# Patient Record
Sex: Female | Born: 1960 | Race: White | Hispanic: No | Marital: Married | Smoking: Never smoker
Health system: Southern US, Community
[De-identification: ages and names within clinical notes are randomized; demographics above are authoritative.]

## PROBLEM LIST (undated history)

## (undated) DIAGNOSIS — J45909 Unspecified asthma, uncomplicated: Secondary | ICD-10-CM

## (undated) DIAGNOSIS — M797 Fibromyalgia: Secondary | ICD-10-CM

## (undated) HISTORY — DX: Unspecified asthma, uncomplicated: J45.909

---

## 2015-07-11 ENCOUNTER — Other Ambulatory Visit: Payer: Self-pay

## 2015-07-11 DIAGNOSIS — Z1231 Encounter for screening mammogram for malignant neoplasm of breast: Secondary | ICD-10-CM

## 2015-08-15 ENCOUNTER — Ambulatory Visit
Admission: RE | Admit: 2015-08-15 | Discharge: 2015-08-15 | Disposition: A | Discharge status: Home / Self Care | Payer: BC Managed Care – PPO | Source: Ambulatory Visit

## 2015-08-15 DIAGNOSIS — Z1231 Encounter for screening mammogram for malignant neoplasm of breast: Secondary | ICD-10-CM

## 2016-03-19 ENCOUNTER — Other Ambulatory Visit: Payer: Self-pay | Admitting: Allergy

## 2016-03-19 ENCOUNTER — Ambulatory Visit
Admission: RE | Admit: 2016-03-19 | Discharge: 2016-03-19 | Disposition: A | Discharge status: Home / Self Care | Payer: BC Managed Care – PPO | Source: Ambulatory Visit | Attending: Allergy | Admitting: Allergy

## 2016-03-19 DIAGNOSIS — J4530 Mild persistent asthma, uncomplicated: Secondary | ICD-10-CM

## 2016-03-31 DIAGNOSIS — K219 Gastro-esophageal reflux disease without esophagitis: Secondary | ICD-10-CM | POA: Insufficient documentation

## 2016-03-31 DIAGNOSIS — J4540 Moderate persistent asthma, uncomplicated: Secondary | ICD-10-CM | POA: Insufficient documentation

## 2016-05-26 ENCOUNTER — Ambulatory Visit: Payer: BC Managed Care – PPO

## 2016-06-08 ENCOUNTER — Ambulatory Visit: Payer: BC Managed Care – PPO | Admitting: Physical Therapy

## 2016-06-11 ENCOUNTER — Ambulatory Visit: Payer: BC Managed Care – PPO

## 2016-09-17 ENCOUNTER — Other Ambulatory Visit: Payer: Self-pay | Admitting: Family Medicine

## 2016-09-17 DIAGNOSIS — Z1231 Encounter for screening mammogram for malignant neoplasm of breast: Secondary | ICD-10-CM

## 2016-10-05 ENCOUNTER — Ambulatory Visit
Admission: RE | Admit: 2016-10-05 | Discharge: 2016-10-05 | Disposition: A | Discharge status: Home / Self Care | Payer: BC Managed Care – PPO | Source: Ambulatory Visit | Attending: Family Medicine | Admitting: Family Medicine

## 2016-10-05 DIAGNOSIS — Z1231 Encounter for screening mammogram for malignant neoplasm of breast: Secondary | ICD-10-CM

## 2017-05-22 ENCOUNTER — Emergency Department (HOSPITAL_COMMUNITY): Payer: BC Managed Care – PPO

## 2017-05-22 ENCOUNTER — Encounter (HOSPITAL_COMMUNITY): Payer: Self-pay

## 2017-05-22 DIAGNOSIS — R0789 Other chest pain: Secondary | ICD-10-CM | POA: Insufficient documentation

## 2017-05-22 LAB — CBC
HCT: 33.9 % — ABNORMAL LOW (ref 36.0–46.0)
Hemoglobin: 11.2 g/dL — ABNORMAL LOW (ref 12.0–15.0)
MCH: 29.3 pg (ref 26.0–34.0)
MCHC: 33 g/dL (ref 30.0–36.0)
MCV: 88.7 fL (ref 78.0–100.0)
PLATELETS: 179 10*3/uL (ref 150–400)
RBC: 3.82 MIL/uL — AB (ref 3.87–5.11)
RDW: 13.2 % (ref 11.5–15.5)
WBC: 6.5 10*3/uL (ref 4.0–10.5)

## 2017-05-22 LAB — BASIC METABOLIC PANEL
Anion gap: 6 (ref 5–15)
BUN: 15 mg/dL (ref 6–20)
CALCIUM: 9.7 mg/dL (ref 8.9–10.3)
CHLORIDE: 103 mmol/L (ref 101–111)
CO2: 29 mmol/L (ref 22–32)
CREATININE: 0.77 mg/dL (ref 0.44–1.00)
Glucose, Bld: 111 mg/dL — ABNORMAL HIGH (ref 65–99)
Potassium: 3.4 mmol/L — ABNORMAL LOW (ref 3.5–5.1)
SODIUM: 138 mmol/L (ref 135–145)

## 2017-05-22 LAB — I-STAT TROPONIN, ED: TROPONIN I, POC: 0 ng/mL (ref 0.00–0.08)

## 2017-05-22 NOTE — ED Triage Notes (Signed)
Pt. Reports waking up yesterday morning with chest pain below the shoulders that radiates to her stomach. Pt. Reports it did not wake her up from her sleep. Pt. Denies n/v/ diaphoresis.

## 2017-05-23 ENCOUNTER — Emergency Department (HOSPITAL_COMMUNITY)
Admission: EM | Admit: 2017-05-23 | Discharge: 2017-05-23 | Disposition: A | Discharge status: Home / Self Care | Payer: BC Managed Care – PPO | Attending: Emergency Medicine | Admitting: Emergency Medicine

## 2017-05-23 DIAGNOSIS — R0789 Other chest pain: Secondary | ICD-10-CM

## 2017-05-23 LAB — LIPASE, BLOOD: Lipase: 28 U/L (ref 11–51)

## 2017-05-23 LAB — I-STAT TROPONIN, ED: Troponin i, poc: 0 ng/mL (ref 0.00–0.08)

## 2017-05-23 LAB — HEPATIC FUNCTION PANEL
ALK PHOS: 49 U/L (ref 38–126)
ALT: 20 U/L (ref 14–54)
AST: 24 U/L (ref 15–41)
Albumin: 3.9 g/dL (ref 3.5–5.0)
Total Bilirubin: 0.5 mg/dL (ref 0.3–1.2)
Total Protein: 6 g/dL — ABNORMAL LOW (ref 6.5–8.1)

## 2017-05-23 MED ORDER — ONDANSETRON 4 MG PO TBDP
4.0000 mg | ORAL_TABLET | Freq: Once | ORAL | Status: AC
  Administered 2017-05-23: 4 mg via ORAL
  Filled 2017-05-23: qty 1

## 2017-05-23 MED ORDER — PANTOPRAZOLE SODIUM 40 MG PO TBEC
40.0000 mg | DELAYED_RELEASE_TABLET | Freq: Once | ORAL | Status: AC
  Administered 2017-05-23: 40 mg via ORAL
  Filled 2017-05-23: qty 1

## 2017-05-23 MED ORDER — GI COCKTAIL ~~LOC~~
30.0000 mL | Freq: Once | ORAL | Status: AC
  Administered 2017-05-23: 30 mL via ORAL
  Filled 2017-05-23: qty 30

## 2017-05-23 MED ORDER — SIMETHICONE 40 MG/0.6ML PO SUSP (UNIT DOSE)
40.0000 mg | Freq: Once | ORAL | Status: AC
  Administered 2017-05-23: 40 mg via ORAL
  Filled 2017-05-23: qty 0.6

## 2017-05-23 NOTE — Discharge Instructions (Signed)
You have had normal labs today including 2 normal sets of cardiac enzymes. Your chest x-ray was clear. Please follow-up closely with your primary care provider.

## 2017-05-23 NOTE — ED Provider Notes (Signed)
TIME SEEN: 3:39 AM  CHIEF COMPLAINT: Chest pain  HPI: Patient is a 56 year old female with history of asthma who presents emergency department with complaints of left-sided chest pain that started yesterday morning when she woke up from sleep. He describes the pain is constant but it does wax and wane. States it feels like "gas pain". No aggravating or relieving factors. It is not exertional or pleuritic. Does not change any food. Has tried Tylenol and Mylanta without relief. No associated shortness of breath, nausea or vomiting, diaphoresis or dizziness. No history of hypertension, diabetes, hyperlipidemia or tobacco use. Does have a family history of a father who has had 3 MIs. His first MI was in his 41s.  No history of PE, DVT, exogenous estrogen use, recent fractures, surgery, trauma, hospitalization or prolonged travel. No lower extremity swelling or pain. No calf tenderness.  ROS: See HPI Constitutional: no fever  Eyes: no drainage  ENT: no runny nose   Cardiovascular:   chest pain  Resp: no SOB  GI: no vomiting GU: no dysuria Integumentary: no rash  Allergy: no hives  Musculoskeletal: no leg swelling  Neurological: no slurred speech ROS otherwise negative  PAST MEDICAL HISTORY/PAST SURGICAL HISTORY:  No past medical history on file.  MEDICATIONS:  Prior to Admission medications   Not on File    ALLERGIES:  Not on File  SOCIAL HISTORY:  Social History  Substance Use Topics  . Smoking status: Never Smoker  . Smokeless tobacco: Never Used  . Alcohol use Not on file    FAMILY HISTORY: No family history on file.  EXAM: BP (!) 157/102 (BP Location: Right Arm)   Pulse 62   Temp 98.2 F (36.8 C) (Oral)   Resp 18   Ht  (1.803 m)   Wt 79.4 kg (175 lb)   SpO2 100%   BMI 24.41 kg/m  CONSTITUTIONAL: Alert and oriented and responds appropriately to questions. Well-appearing; well-nourished HEAD: Normocephalic EYES: Conjunctivae clear, pupils appear equal,  EOMI ENT: normal nose; moist mucous membranes NECK: Supple, no meningismus, no nuchal rigidity, no LAD  CARD: RRR; S1 and S2 appreciated; no murmurs, no clicks, no rubs, no gallops CHEST:  Chest wall is mildly tender to palpation underneath the left breast.  No crepitus, ecchymosis, erythema, warmth, rash or other lesions present.   BREAST:  No breast mass palpated on exam.  Left breast is non-tender to palpation.  No erythema, warmth, induration, fluctuance.  No nipple discharge.  No inverted nipple.  No skin changes.  No axillary lymphadenopathy. RESP: Normal chest excursion without splinting or tachypnea; breath sounds clear and equal bilaterally; no wheezes, no rhonchi, no rales, no hypoxia or respiratory distress, speaking full sentences ABD/GI: Normal bowel sounds; non-distended; soft, non-tender, no rebound, no guarding, no peritoneal signs, no hepatosplenomegaly BACK:  The back appears normal and is non-tender to palpation, there is no CVA tenderness EXT: Normal ROM in all joints; non-tender to palpation; no edema; normal capillary refill; no cyanosis, no calf tenderness or swelling    SKIN: Normal color for age and race; warm; no rash NEURO: Moves all extremities equally PSYCH: The patient's mood and manner are appropriate. Grooming and personal hygiene are appropriate.  MEDICAL DECISION MAKING: Patient here with atypical chest pain. She has no significant risk factors for ACS other than age and family history. EKG shows no ischemic abnormality. Doubt PE or dissection. She has had 2 negative troponins. LFTs, lipase, creatinine normal. No leukocytosis. Chest x-ray clear. We'll treat symptoms  a GI cocktail, Protonix, Zofran and simethicone and reassess. Suspect this is more GI related.  ED PROGRESS: 4:50 AM  Pt received her medications but does not want to wait to see if they work to help her symptoms. She states she can follow-up with her primary care provider. She is comfortable with plan to  go home. Discussed return precautions.   At this time, I do not feel there is any life-threatening condition present. I have reviewed and discussed all results (EKG, imaging, lab, urine as appropriate) and exam findings with patient/family. I have reviewed nursing notes and appropriate previous records.  I feel the patient is safe to be discharged home without further emergent workup and can continue workup as an outpatient as needed. Discussed usual and customary return precautions. Patient/family verbalize understanding and are comfortable with this plan.  Outpatient follow-up has been provided if needed. All questions have been answered.      EKG Interpretation  Date/Time:  Saturday May 22 2017 20:18:12 EDT Ventricular Rate:  70 PR Interval:  142 QRS Duration: 110 QT Interval:  406 QTC Calculation: 438 R Axis:   50 Text Interpretation:  Normal sinus rhythm Incomplete right bundle branch block Nonspecific ST and T wave abnormality Abnormal ECG No significant change since last tracing Confirmed by Ward, Baxter Hire [phone]) on 05/23/2017 4:18:52 AM         Ward, Layla Maw, DO 05/23/17 0451

## 2017-09-20 ENCOUNTER — Other Ambulatory Visit: Payer: Self-pay | Admitting: Family Medicine

## 2017-09-20 DIAGNOSIS — Z1231 Encounter for screening mammogram for malignant neoplasm of breast: Secondary | ICD-10-CM

## 2017-10-11 ENCOUNTER — Ambulatory Visit
Admission: RE | Admit: 2017-10-11 | Discharge: 2017-10-11 | Disposition: A | Discharge status: Home / Self Care | Payer: BC Managed Care – PPO | Source: Ambulatory Visit | Attending: Family Medicine | Admitting: Family Medicine

## 2017-10-11 DIAGNOSIS — Z1231 Encounter for screening mammogram for malignant neoplasm of breast: Secondary | ICD-10-CM

## 2018-04-05 ENCOUNTER — Encounter: Payer: Self-pay | Admitting: Podiatry

## 2018-04-05 ENCOUNTER — Ambulatory Visit: Payer: BC Managed Care – PPO | Admitting: Podiatry

## 2018-04-05 ENCOUNTER — Ambulatory Visit (INDEPENDENT_AMBULATORY_CARE_PROVIDER_SITE_OTHER): Payer: BC Managed Care – PPO

## 2018-04-05 DIAGNOSIS — M2011 Hallux valgus (acquired), right foot: Secondary | ICD-10-CM

## 2018-04-05 DIAGNOSIS — M2012 Hallux valgus (acquired), left foot: Secondary | ICD-10-CM

## 2018-04-05 DIAGNOSIS — M2142 Flat foot [pes planus] (acquired), left foot: Secondary | ICD-10-CM

## 2018-04-05 DIAGNOSIS — M722 Plantar fascial fibromatosis: Secondary | ICD-10-CM

## 2018-04-05 DIAGNOSIS — M2141 Flat foot [pes planus] (acquired), right foot: Secondary | ICD-10-CM | POA: Diagnosis not present

## 2018-04-05 NOTE — Progress Notes (Signed)
Subjective:    Patient ID: Rachel Fuentes, female    DOB: 03/01/1961, 57 y.o.   MRN: [id number]  HPI 57 year old female presents the office today for concerns of bunions to both of her feet.  She has had orthotics for several years she has a pair that is comfortable but very worn out.  She tried getting a secondary pair however they were uncomfortable she was not able to tolerate them.  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  No recent injury.  No other concerns.  Review of Systems  All other systems reviewed and are negative.  History reviewed. No pertinent past medical history.  History reviewed. No pertinent surgical history.   Current Outpatient Medications:  .  BREO ELLIPTA 200-25 MCG/INH AEPB, INL 1 PUFF PO QD, Disp: , Rfl: 1 .  buPROPion (WELLBUTRIN XL) 300 MG 24 hr tablet, TK 1 T PO ONCE QAM, Disp: , Rfl: 3 .  diclofenac (VOLTAREN) 50 MG EC tablet, TK 1 T PO TID WF OR MILK, Disp: , Rfl: 3 .  fluconazole (DIFLUCAN) 150 MG tablet, TK 1 T PO 1 TIME WEEKLY, Disp: , Rfl: 4 .  FLUoxetine (PROZAC) 40 MG capsule, TK 1 C PO QD, Disp: , Rfl: 3 .  gabapentin (NEURONTIN) 400 MG capsule, TK 2 CS PO HS, Disp: , Rfl: 2 .  gabapentin (NEURONTIN) 800 MG tablet, TK 1 T PO HS, Disp: , Rfl: 3 .  levocetirizine (XYZAL) 5 MG tablet, TK 1 T PO ONCE QPM, Disp: , Rfl: 0 .  levothyroxine (SYNTHROID, LEVOTHROID) 50 MCG tablet, TK 1 T PO QD, Disp: , Rfl: 2 .  mometasone (NASONEX) 50 MCG/ACT nasal spray, , Disp: , Rfl:  .  montelukast (SINGULAIR) 10 MG tablet, TK 1 T PO ONCE A DAY IN THE EVE, Disp: , Rfl: 0 .  PREMARIN vaginal cream, , Disp: , Rfl: 11 .  traMADol (ULTRAM) 50 MG tablet, , Disp: , Rfl:  .  trazodone (DESYREL) 300 MG tablet, TK 1 T PO HS PRN, Disp: , Rfl: 11 .  valACYclovir (VALTREX) 1000 MG tablet, TK 1 T PO TID PRN DURING FLARE, Disp: , Rfl: 8  Not on File      Objective:   Physical Exam  General: AAO x3,  NAD  Dermatological: Skin is warm, dry and supple bilateral. Nails x 10 are well manicured; remaining integument appears unremarkable at this time. There are no open sores, no preulcerative lesions, no rash or signs of infection present.  Vascular: Dorsalis Pedis artery and Posterior Tibial artery pedal pulses are 2/4 bilateral with immedate capillary fill time. Pedal hair growth present. No varicosities and no lower extremity edema present bilateral. There is no pain with calf compression, swelling, warmth, erythema.   Neruologic: Grossly intact via light touch bilateral.. Protective threshold with Semmes Wienstein monofilament intact to all pedal sites bilateral.  Musculoskeletal: Moderate bunion deformity is present bilaterally.  There is no significant discomfort on the bunion deformities present today.  There is no pain or crepitation with MPJ range of motion.  Decreased medial arch height.  At this time there is no tenderness along the arch of the foot but at times she does get this.  Plantar fascial, Achilles tendon appear to be intact.  No edema, erythema.  Muscular strength 5/5 in all groups tested bilateral.  Gait: Unassisted, Nonantalgic.     Assessment & Plan:  57 year old female with bilateral bunion deformities, arch pain/plantar fasciitis -Treatment options discussed including all alternatives, risks, and complications -Etiology of symptoms were discussed -X-rays were obtained and reviewed with the patient.  Bunion deformities present.  There is no evidence of acute fracture identified today. -We discussed various treatment options.  She will get a new pair of orthotics.  Rick evaluated her today she was molded for inserts.  Discussed shoe modifications as well.  We also discussed surgery in the future if symptoms continue or worsen.  Vivi Barrack DPM

## 2018-04-19 ENCOUNTER — Ambulatory Visit: Payer: BC Managed Care – PPO | Admitting: Orthotics

## 2018-04-19 DIAGNOSIS — M722 Plantar fascial fibromatosis: Secondary | ICD-10-CM

## 2018-04-19 DIAGNOSIS — M2142 Flat foot [pes planus] (acquired), left foot: Secondary | ICD-10-CM

## 2018-04-19 DIAGNOSIS — M2012 Hallux valgus (acquired), left foot: Principal | ICD-10-CM

## 2018-04-19 DIAGNOSIS — M2141 Flat foot [pes planus] (acquired), right foot: Secondary | ICD-10-CM

## 2018-04-19 DIAGNOSIS — M2011 Hallux valgus (acquired), right foot: Secondary | ICD-10-CM

## 2018-04-19 NOTE — Progress Notes (Signed)
Patient came in today to pick up custom made foot orthotics.  The goals were accomplished and the patient reported no dissatisfaction with said orthotics.  Patient was advised of breakin period and how to report any issues. 

## 2018-10-25 ENCOUNTER — Other Ambulatory Visit: Payer: Self-pay | Admitting: Family Medicine

## 2018-10-25 DIAGNOSIS — Z1231 Encounter for screening mammogram for malignant neoplasm of breast: Secondary | ICD-10-CM

## 2018-11-22 ENCOUNTER — Ambulatory Visit: Payer: BC Managed Care – PPO

## 2019-04-12 ENCOUNTER — Other Ambulatory Visit: Payer: Self-pay | Admitting: Emergency Medicine

## 2019-04-12 DIAGNOSIS — Z20822 Contact with and (suspected) exposure to covid-19: Secondary | ICD-10-CM

## 2019-04-13 LAB — NOVEL CORONAVIRUS, NAA: SARS-CoV-2, NAA: NOT DETECTED

## 2019-05-29 ENCOUNTER — Other Ambulatory Visit: Payer: Self-pay

## 2019-05-29 ENCOUNTER — Ambulatory Visit
Admission: RE | Admit: 2019-05-29 | Discharge: 2019-05-29 | Disposition: A | Discharge status: Home / Self Care | Payer: BC Managed Care – PPO | Source: Ambulatory Visit | Attending: Family Medicine | Admitting: Family Medicine

## 2019-05-29 DIAGNOSIS — Z1231 Encounter for screening mammogram for malignant neoplasm of breast: Secondary | ICD-10-CM

## 2020-01-30 ENCOUNTER — Other Ambulatory Visit: Payer: Self-pay | Admitting: Family Medicine

## 2020-01-30 DIAGNOSIS — Z1231 Encounter for screening mammogram for malignant neoplasm of breast: Secondary | ICD-10-CM

## 2020-02-22 ENCOUNTER — Other Ambulatory Visit: Payer: Self-pay | Admitting: Family Medicine

## 2020-02-22 DIAGNOSIS — E2839 Other primary ovarian failure: Secondary | ICD-10-CM

## 2020-04-05 ENCOUNTER — Ambulatory Visit: Payer: BC Managed Care – PPO | Admitting: Dietician

## 2020-04-12 ENCOUNTER — Ambulatory Visit: Payer: BC Managed Care – PPO | Admitting: Allergy

## 2020-05-01 ENCOUNTER — Ambulatory Visit: Payer: BC Managed Care – PPO | Admitting: Dietician

## 2020-05-29 ENCOUNTER — Ambulatory Visit: Payer: BC Managed Care – PPO

## 2020-06-03 DIAGNOSIS — M546 Pain in thoracic spine: Secondary | ICD-10-CM | POA: Insufficient documentation

## 2020-06-11 ENCOUNTER — Other Ambulatory Visit: Payer: BC Managed Care – PPO

## 2020-06-24 ENCOUNTER — Other Ambulatory Visit: Payer: Self-pay

## 2020-06-24 ENCOUNTER — Ambulatory Visit
Admission: RE | Admit: 2020-06-24 | Discharge: 2020-06-24 | Disposition: A | Discharge status: Home / Self Care | Payer: BC Managed Care – PPO | Source: Ambulatory Visit | Attending: Family Medicine | Admitting: Family Medicine

## 2020-06-24 DIAGNOSIS — E2839 Other primary ovarian failure: Secondary | ICD-10-CM

## 2020-06-28 ENCOUNTER — Other Ambulatory Visit: Payer: Self-pay

## 2020-06-28 ENCOUNTER — Encounter: Payer: Self-pay | Admitting: Pulmonary Disease

## 2020-06-28 ENCOUNTER — Ambulatory Visit: Payer: BC Managed Care – PPO | Admitting: Pulmonary Disease

## 2020-06-28 ENCOUNTER — Ambulatory Visit (INDEPENDENT_AMBULATORY_CARE_PROVIDER_SITE_OTHER): Payer: BC Managed Care – PPO

## 2020-06-28 VITALS — BP 108/68 | HR 88 | Temp 97.7°F | Ht 71.0 in | Wt 169.8 lb

## 2020-06-28 DIAGNOSIS — J45909 Unspecified asthma, uncomplicated: Secondary | ICD-10-CM

## 2020-06-28 DIAGNOSIS — R0602 Shortness of breath: Secondary | ICD-10-CM

## 2020-06-28 MED ORDER — TRELEGY ELLIPTA 200-62.5-25 MCG/INH IN AEPB: 1.0000 | INHALATION_SPRAY | Freq: Every day | RESPIRATORY_TRACT | 0 refills | Status: DC

## 2020-06-28 NOTE — Patient Instructions (Signed)
Uncontrolled asthma Multiple allergies  We will switch you from Breo to Trelegy 200, 1 puff daily  Continue albuterol as needed  Complete course of antibiotics and steroids  Blood work to include CBC, eosinophils, IgE levels-this should be done greater than 2 weeks after being off steroids  Obtain pulmonary function tests  Chest x-ray  I will see you back in 6 to 8 weeks

## 2020-06-28 NOTE — Progress Notes (Signed)
Rachel Fuentes    [id number]    [birth year]  Primary Care Physician:Webb, Okey Regal, MD  Referring Physician: Shirlean Mylar, MD [address] [place],  [place] [address]  Chief complaint:   Patient with history of asthma Uncontrolled symptoms for the last 14 months  HPI:  Worsening symptoms of asthma, shortness of breath Does not usually wheeze  Multiple allergens to cats and dogs Did change flooring but noticed that breathing is worse  Albuterol is not working as well as he used to  She was taking allergy shots for multiple allergies  Asthma is for most of her adult life  She does have a history of reflux-on famotidine History of gastroparesis, fibromyalgia, osteoarthritis, anxiety and depression  Currently on Breo, Singulair  Occupational history-office work, walk to the grocery store  Any kind of activity recently contributes to shortness of breath  Pets: Dogs and cats  Occupation: Office work, grocery  Exposures: New flooring did make symptoms worse  Smoking history: Never smoker, was exposed to a lot of secondhand smoke growing up  Outpatient Encounter Medications as of 06/28/2020  Medication Sig  . azithromycin (ZITHROMAX) 250 MG tablet Take by mouth.  Marland Kitchen BREO ELLIPTA 200-25 MCG/INH AEPB INL 1 PUFF PO QD  . buPROPion (WELLBUTRIN XL) 300 MG 24 hr tablet TK 1 T PO ONCE QAM  . diclofenac (VOLTAREN) 50 MG EC tablet TK 1 T PO TID WF OR MILK  . fluconazole (DIFLUCAN) 150 MG tablet TK 1 T PO 1 TIME WEEKLY  . FLUoxetine (PROZAC) 40 MG capsule TK 1 C PO QD  . gabapentin (NEURONTIN) 400 MG capsule TK 2 CS PO HS  . gabapentin (NEURONTIN) 800 MG tablet TK 1 T PO HS  . levocetirizine (XYZAL) 5 MG tablet TK 1 T PO ONCE QPM  . levothyroxine (SYNTHROID, LEVOTHROID) 50 MCG tablet TK 1 T PO QD  . mometasone (NASONEX) 50 MCG/ACT nasal spray   . montelukast (SINGULAIR) 10 MG tablet TK 1 T PO ONCE A DAY IN THE EVE  . predniSONE (STERAPRED  UNI-PAK 21 TAB) 10 MG (21) TBPK tablet Use as directed on packaging.  . traMADol (ULTRAM) 50 MG tablet   . trazodone (DESYREL) 300 MG tablet TK 1 T PO HS PRN  . valACYclovir (VALTREX) 1000 MG tablet TK 1 T PO TID PRN DURING FLARE  . PREMARIN vaginal cream    No facility-administered encounter medications on file as of 06/28/2020.    Allergies as of 06/28/2020  . (Not on File)    Past Medical History:  Diagnosis Date  . Asthma     No past surgical history on file.  Family History  Problem Relation Age of Onset  . Breast cancer Neg Hx     Social History   Socioeconomic History  . Marital status: Married    Spouse name: Not on file  . Number of children: Not on file  . Years of education: Not on file  . Highest education level: Not on file  Occupational History  . Not on file  Tobacco Use  . Smoking status: Never Smoker  . Smokeless tobacco: Never Used  Substance and Sexual Activity  . Alcohol use: Not on file  . Drug use: Not on file  . Sexual activity: Not on file  Other Topics Concern  . Not on file  Social History Narrative  . Not on file   Social Determinants of Health   Financial  Resource Strain:   . Difficulty of Paying Living Expenses: Not on file  Food Insecurity:   . Worried About Programme researcher, broadcasting/film/video in the Last Year: Not on file  . Ran Out of Food in the Last Year: Not on file  Transportation Needs:   . Lack of Transportation (Medical): Not on file  . Lack of Transportation (Non-Medical): Not on file  Physical Activity:   . Days of Exercise per Week: Not on file  . Minutes of Exercise per Session: Not on file  Stress:   . Feeling of Stress : Not on file  Social Connections:   . Frequency of Communication with Friends and Family: Not on file  . Frequency of Social Gatherings with Friends and Family: Not on file  . Attends Religious Services: Not on file  . Active Member of Clubs or Organizations: Not on file  . Attends Banker  Meetings: Not on file  . Marital Status: Not on file  Intimate Partner Violence:   . Fear of Current or Ex-Partner: Not on file  . Emotionally Abused: Not on file  . Physically Abused: Not on file  . Sexually Abused: Not on file    Review of Systems  Constitutional: Positive for fatigue.  Respiratory: Positive for shortness of breath.     Vitals:   06/28/20 1002  BP: 108/68  Pulse: 88  Temp: 97.7 F (36.5 C)  SpO2: 98%     Physical Exam Constitutional:      Appearance: Normal appearance.  HENT:     Head: Normocephalic and atraumatic.     Mouth/Throat:     Mouth: Mucous membranes are moist.  Eyes:     General:        Right eye: No discharge.        Left eye: No discharge.  Cardiovascular:     Rate and Rhythm: Normal rate and regular rhythm.     Pulses: Normal pulses.     Heart sounds: Normal heart sounds. No murmur heard.  No friction rub.  Pulmonary:     Effort: Pulmonary effort is normal. No respiratory distress.     Breath sounds: Normal breath sounds. No stridor. No wheezing or rhonchi.  Musculoskeletal:     Cervical back: Normal range of motion. No rigidity.  Psychiatric:        Mood and Affect: Mood normal.    Data Reviewed: No previous x-rays or other studies available  Assessment:  Shortness of breath  Moderate persistent asthma  Multiple allergies   History of anxiety and depression -Anxiety regarding recent uncontrolled symptoms may also be playing a role in worsening symptoms  Significance of avoidance of triggers discussed extensively -She is known to be allergic to dogs and cats and she does have 4 cats -She also did notice significant worsening of symptoms with flooring change  Plan/Recommendations: Obtain CBC with differentials, IgE levels  Chest x-ray  Switch from Breo to Trelegy 200  Encouraged to avoid known triggers  Obtain PFT  Follow-up in 6 to 8 weeks   Virl Diamond MD [facility] 06/28/2020, 10:44 AM  CC: Shirlean Mylar, MD

## 2020-07-01 ENCOUNTER — Other Ambulatory Visit: Payer: Self-pay

## 2020-07-01 ENCOUNTER — Ambulatory Visit
Admission: RE | Admit: 2020-07-01 | Discharge: 2020-07-01 | Disposition: A | Discharge status: Home / Self Care | Payer: BC Managed Care – PPO | Source: Ambulatory Visit | Attending: Family Medicine | Admitting: Family Medicine

## 2020-07-01 DIAGNOSIS — Z1231 Encounter for screening mammogram for malignant neoplasm of breast: Secondary | ICD-10-CM

## 2020-07-22 ENCOUNTER — Institutional Professional Consult (permissible substitution): Payer: BC Managed Care – PPO | Admitting: Pulmonary Disease

## 2020-07-25 ENCOUNTER — Telehealth: Payer: Self-pay | Admitting: Pulmonary Disease

## 2020-07-25 DIAGNOSIS — J4540 Moderate persistent asthma, uncomplicated: Secondary | ICD-10-CM

## 2020-07-25 MED ORDER — TRELEGY ELLIPTA 200-62.5-25 MCG/INH IN AEPB: 1.0000 | INHALATION_SPRAY | Freq: Every day | RESPIRATORY_TRACT | 5 refills | Status: DC

## 2020-07-25 NOTE — Telephone Encounter (Signed)
I have sent the refills of the trelegy to the pharmacy.  I called the pt and she is aware. Nothing further is needed.

## 2020-10-11 ENCOUNTER — Other Ambulatory Visit: Payer: Self-pay | Admitting: Family Medicine

## 2020-10-11 DIAGNOSIS — Z8249 Family history of ischemic heart disease and other diseases of the circulatory system: Secondary | ICD-10-CM

## 2020-10-11 DIAGNOSIS — E785 Hyperlipidemia, unspecified: Secondary | ICD-10-CM

## 2020-12-10 ENCOUNTER — Other Ambulatory Visit (HOSPITAL_COMMUNITY)
Admission: RE | Admit: 2020-12-10 | Discharge: 2020-12-10 | Disposition: A | Discharge status: Home / Self Care | Payer: BC Managed Care – PPO | Source: Ambulatory Visit | Attending: Pulmonary Disease | Admitting: Pulmonary Disease

## 2020-12-10 ENCOUNTER — Ambulatory Visit: Payer: BC Managed Care – PPO | Admitting: Adult Health

## 2020-12-10 DIAGNOSIS — Z20822 Contact with and (suspected) exposure to covid-19: Secondary | ICD-10-CM | POA: Insufficient documentation

## 2020-12-10 DIAGNOSIS — Z01812 Encounter for preprocedural laboratory examination: Secondary | ICD-10-CM | POA: Insufficient documentation

## 2020-12-11 LAB — SARS CORONAVIRUS 2 (TAT 6-24 HRS): SARS Coronavirus 2: NEGATIVE

## 2020-12-13 ENCOUNTER — Ambulatory Visit: Payer: BC Managed Care – PPO | Admitting: Pulmonary Disease

## 2020-12-13 ENCOUNTER — Other Ambulatory Visit: Payer: Self-pay

## 2020-12-13 ENCOUNTER — Encounter: Payer: Self-pay | Admitting: Pulmonary Disease

## 2020-12-13 ENCOUNTER — Ambulatory Visit (INDEPENDENT_AMBULATORY_CARE_PROVIDER_SITE_OTHER): Payer: BC Managed Care – PPO | Admitting: Pulmonary Disease

## 2020-12-13 DIAGNOSIS — J4540 Moderate persistent asthma, uncomplicated: Secondary | ICD-10-CM | POA: Diagnosis not present

## 2020-12-13 DIAGNOSIS — R0602 Shortness of breath: Secondary | ICD-10-CM | POA: Diagnosis not present

## 2020-12-13 LAB — PULMONARY FUNCTION TEST
DL/VA % pred: 97 %
DL/VA: 3.89 ml/min/mmHg/L
DLCO cor % pred: 99 %
DLCO cor: 25.15 ml/min/mmHg
DLCO unc % pred: 99 %
DLCO unc: 25.15 ml/min/mmHg
FEF 25-75 Post: 3.18 L/sec
FEF 25-75 Pre: 3.11 L/sec
FEF2575-%Change-Post: 2 %
FEF2575-%Pred-Post: 114 %
FEF2575-%Pred-Pre: 112 %
FEV1-%Change-Post: 1 %
FEV1-%Pred-Post: 107 %
FEV1-%Pred-Pre: 105 %
FEV1-Post: 3.49 L
FEV1-Pre: 3.45 L
FEV1FVC-%Change-Post: 3 %
FEV1FVC-%Pred-Pre: 100 %
FEV6-%Change-Post: -1 %
FEV6-%Pred-Post: 105 %
FEV6-%Pred-Pre: 107 %
FEV6-Post: 4.31 L
FEV6-Pre: 4.39 L
FEV6FVC-%Change-Post: 0 %
FEV6FVC-%Pred-Post: 103 %
FEV6FVC-%Pred-Pre: 103 %
FVC-%Change-Post: -2 %
FVC-%Pred-Post: 102 %
FVC-%Pred-Pre: 104 %
FVC-Post: 4.31 L
FVC-Pre: 4.4 L
Post FEV1/FVC ratio: 81 %
Post FEV6/FVC ratio: 100 %
Pre FEV1/FVC ratio: 78 %
Pre FEV6/FVC Ratio: 100 %
RV % pred: 121 %
RV: 2.85 L
TLC % pred: 117 %
TLC: 7.19 L

## 2020-12-13 MED ORDER — TRELEGY ELLIPTA 200-62.5-25 MCG/INH IN AEPB: 1.0000 | INHALATION_SPRAY | Freq: Every day | RESPIRATORY_TRACT | 5 refills | Status: DC

## 2020-12-13 NOTE — Progress Notes (Signed)
Rachel Fuentes    [id number]    10/22/1960  Primary Care Physician:Webb, Okey Regal, MD  Referring Physician: Shirlean Mylar, MD [address] [place],  [place] [address]  Chief complaint:   Patient with history of asthma Symptoms did improve with use of Trelegy  HPI:  No significant symptoms today Feels better Trelegy did help Albuterol use about 3 times a week  Still with significant exposure to known triggers  Multiple allergens to cats and dogs Did change flooring but noticed that breathing is worse  She was taking allergy shots for multiple allergies  Asthma is for most of her adult life  She does have a history of reflux-on famotidine History of gastroparesis, fibromyalgia, osteoarthritis, anxiety and depression  We did prescribe Trelegy the last time she was in the office Occupational history-office work  Any kind of activity recently contributes to shortness of breath  Pets: Dogs and cats  Occupation: Office work, grocery  Exposures: New flooring did make symptoms worse  Smoking history: Never smoker, was exposed to a lot of secondhand smoke growing up  Outpatient Encounter Medications as of 12/13/2020  Medication Sig  . BREO ELLIPTA 200-25 MCG/INH AEPB INL 1 PUFF PO QD  . buPROPion (WELLBUTRIN XL) 300 MG 24 hr tablet TK 1 T PO ONCE QAM  . diclofenac (VOLTAREN) 50 MG EC tablet TK 1 T PO TID WF OR MILK  . fluconazole (DIFLUCAN) 150 MG tablet TK 1 T PO 1 TIME WEEKLY  . FLUoxetine (PROZAC) 40 MG capsule TK 1 C PO QD  . Fluticasone-Umeclidin-Vilant (TRELEGY ELLIPTA) 200-62.5-25 MCG/INH AEPB Inhale 1 puff into the lungs daily.  Marland Kitchen gabapentin (NEURONTIN) 400 MG capsule TK 2 CS PO HS  . gabapentin (NEURONTIN) 800 MG tablet TK 1 T PO HS  . levocetirizine (XYZAL) 5 MG tablet TK 1 T PO ONCE QPM  . levothyroxine (SYNTHROID, LEVOTHROID) 50 MCG tablet TK 1 T PO QD  . mometasone (NASONEX) 50 MCG/ACT nasal spray   . montelukast (SINGULAIR)  10 MG tablet TK 1 T PO ONCE A DAY IN THE EVE  . traMADol (ULTRAM) 50 MG tablet   . trazodone (DESYREL) 300 MG tablet TK 1 T PO HS PRN  . valACYclovir (VALTREX) 1000 MG tablet TK 1 T PO TID PRN DURING FLARE  . [DISCONTINUED] azithromycin (ZITHROMAX) 250 MG tablet Take by mouth.  . [DISCONTINUED] predniSONE (STERAPRED UNI-PAK 21 TAB) 10 MG (21) TBPK tablet Use as directed on packaging. (Patient not taking: Reported on 12/13/2020)  . [DISCONTINUED] PREMARIN vaginal cream    No facility-administered encounter medications on file as of 12/13/2020.    Allergies as of 12/13/2020  . (No Known Allergies)    Past Medical History:  Diagnosis Date  . Asthma     No past surgical history on file.  Family History  Problem Relation Age of Onset  . Breast cancer Neg Hx     Social History   Socioeconomic History  . Marital status: Married    Spouse name: Not on file  . Number of children: Not on file  . Years of education: Not on file  . Highest education level: Not on file  Occupational History  . Not on file  Tobacco Use  . Smoking status: Never Smoker  . Smokeless tobacco: Never Used  Substance and Sexual Activity  . Alcohol use: Not on file  . Drug use: Not on file  . Sexual activity: Not on  file  Other Topics Concern  . Not on file  Social History Narrative  . Not on file   Social Determinants of Health   Financial Resource Strain: Not on file  Food Insecurity: Not on file  Transportation Needs: Not on file  Physical Activity: Not on file  Stress: Not on file  Social Connections: Not on file  Intimate Partner Violence: Not on file    Review of Systems  Constitutional: Positive for fatigue.  Respiratory: Positive for shortness of breath.     Vitals:   12/13/20 1159  BP: 114/60  Pulse: 89  Temp: (!) 97.4 F (36.3 C)  SpO2: 95%     Physical Exam Constitutional:      Appearance: Normal appearance.  HENT:     Head: Normocephalic and atraumatic.      Mouth/Throat:     Mouth: Mucous membranes are moist.  Eyes:     General:        Right eye: No discharge.        Left eye: No discharge.  Cardiovascular:     Rate and Rhythm: Normal rate and regular rhythm.     Heart sounds: No murmur heard. No friction rub.  Pulmonary:     Effort: Pulmonary effort is normal. No respiratory distress.     Breath sounds: Normal breath sounds. No stridor. No wheezing or rhonchi.  Musculoskeletal:     Cervical back: No rigidity.  Psychiatric:        Mood and Affect: Mood normal.    Data Reviewed: No previous x-rays or other studies available  Pulmonary function test today shows no obstruction, no significant bronchodilator response, no restriction with normal diffusing capacity  Assessment:  Shortness of breath  Moderate persistent asthma -Improvement in symptoms with use of Trelegy  Multiple allergies -Avoidance of triggers is still the most important way of managing allergies  Significance of avoidance of triggers discussed extensively -She is known to be allergic to dogs and cats and she does have 4 cats -She also did notice significant worsening of symptoms with flooring change  Plan/Recommendations: Continue Trelegy  Encouraged to avoid known triggers   Follow-up in 6 months  Encouraged to call with any significant concerns   Virl Diamond MD [facility] 12/13/2020, 12:02 PM  CC: Shirlean Mylar, MD

## 2020-12-13 NOTE — Patient Instructions (Signed)
Continue with Trelegy-prescription sent into pharmacy  Continue albuterol as needed  Avoid known triggers as possible  I will follow-up with you in about 6 months, call with significant concerns

## 2020-12-13 NOTE — Progress Notes (Signed)
PFT done today. 

## 2021-03-05 ENCOUNTER — Other Ambulatory Visit: Payer: Self-pay | Admitting: Family Medicine

## 2021-03-05 DIAGNOSIS — Z803 Family history of malignant neoplasm of breast: Secondary | ICD-10-CM

## 2021-03-05 DIAGNOSIS — N63 Unspecified lump in unspecified breast: Secondary | ICD-10-CM

## 2021-04-07 ENCOUNTER — Other Ambulatory Visit: Payer: Self-pay | Admitting: Pulmonary Disease

## 2021-04-07 DIAGNOSIS — J4540 Moderate persistent asthma, uncomplicated: Secondary | ICD-10-CM

## 2021-04-09 ENCOUNTER — Other Ambulatory Visit: Payer: Self-pay

## 2021-04-09 ENCOUNTER — Ambulatory Visit
Admission: RE | Admit: 2021-04-09 | Discharge: 2021-04-09 | Disposition: A | Discharge status: Home / Self Care | Payer: BC Managed Care – PPO | Source: Ambulatory Visit | Attending: Family Medicine | Admitting: Family Medicine

## 2021-04-09 DIAGNOSIS — N63 Unspecified lump in unspecified breast: Secondary | ICD-10-CM

## 2021-04-09 DIAGNOSIS — Z803 Family history of malignant neoplasm of breast: Secondary | ICD-10-CM

## 2021-09-25 ENCOUNTER — Ambulatory Visit: Payer: BC Managed Care – PPO | Admitting: Primary Care

## 2021-09-25 ENCOUNTER — Other Ambulatory Visit: Payer: Self-pay

## 2021-09-25 ENCOUNTER — Encounter: Payer: Self-pay | Admitting: Primary Care

## 2021-09-25 ENCOUNTER — Encounter: Payer: Self-pay | Admitting: *Deleted

## 2021-09-25 ENCOUNTER — Ambulatory Visit (INDEPENDENT_AMBULATORY_CARE_PROVIDER_SITE_OTHER): Payer: BC Managed Care – PPO

## 2021-09-25 VITALS — BP 114/80 | HR 89 | Temp 98.1°F | Ht 71.0 in | Wt 161.8 lb

## 2021-09-25 DIAGNOSIS — J4540 Moderate persistent asthma, uncomplicated: Secondary | ICD-10-CM

## 2021-09-25 DIAGNOSIS — K219 Gastro-esophageal reflux disease without esophagitis: Secondary | ICD-10-CM

## 2021-09-25 DIAGNOSIS — R0602 Shortness of breath: Secondary | ICD-10-CM

## 2021-09-25 LAB — CBC WITH DIFFERENTIAL/PLATELET
Basophils Absolute: 0.1 10*3/uL (ref 0.0–0.1)
Basophils Relative: 0.9 % (ref 0.0–3.0)
Eosinophils Absolute: 0.1 10*3/uL (ref 0.0–0.7)
Eosinophils Relative: 1.8 % (ref 0.0–5.0)
HCT: 36.3 % (ref 36.0–46.0)
Hemoglobin: 12.5 g/dL (ref 12.0–15.0)
Lymphocytes Relative: 38.1 % (ref 12.0–46.0)
Lymphs Abs: 2.7 10*3/uL (ref 0.7–4.0)
MCHC: 34.3 g/dL (ref 30.0–36.0)
MCV: 91.2 fl (ref 78.0–100.0)
Monocytes Absolute: 0.6 10*3/uL (ref 0.1–1.0)
Monocytes Relative: 8.5 % (ref 3.0–12.0)
Neutro Abs: 3.6 10*3/uL (ref 1.4–7.7)
Neutrophils Relative %: 50.7 % (ref 43.0–77.0)
Platelets: 248 10*3/uL (ref 150.0–400.0)
RBC: 3.98 Mil/uL (ref 3.87–5.11)
RDW: 12.8 % (ref 11.5–15.5)
WBC: 7.1 10*3/uL (ref 4.0–10.5)

## 2021-09-25 LAB — NITRIC OXIDE: Nitric Oxide: 19

## 2021-09-25 LAB — BRAIN NATRIURETIC PEPTIDE: Pro B Natriuretic peptide (BNP): 25 pg/mL (ref 0.0–100.0)

## 2021-09-25 MED ORDER — PREDNISONE 10 MG PO TABS: ORAL_TABLET | ORAL | 0 refills | Status: DC

## 2021-09-25 NOTE — Progress Notes (Signed)
Please let patient know CXR showed no evidence of pneumonia. Hyperinflated lungs consistent with underlying asthma

## 2021-09-25 NOTE — Assessment & Plan Note (Addendum)
-   Prolonged asthma exacerbation. Temporary improvement with prednisone 50mg  x 5 days. GERD could be contributing. Checking CXR and cbc with diff/IgE today. FENO was 19. Continue Trelegy one puff daily. Sending in extended prednisone taper 40mg  x 4 days, 30mg  x 4 days, 20mg  x 4 days, 10mg  x 4 days. If labs and CXR are normal, consider further cardiac work up.

## 2021-09-25 NOTE — Assessment & Plan Note (Signed)
-   Advised patient to resume famotidine 20mg  at bedtime

## 2021-09-25 NOTE — Progress Notes (Signed)
@Patient  ID: Rachel Fuentes, female    DOB: 01-Dec-1960, 61 y.o.   MRN: [id number]  Chief Complaint  Patient presents with   Follow-up    Asthma flare.  Started about a month ago, leveled off and then started up again.  Took 50 mg of prednisone for 5 days, did not feel any better until day 4 and now feels bad again.  Sob all the time, worse with exertion, wheezing with deep breath.    Referring provider: Shirlean Mylar, MD  HPI: 61 year old female, never smoked. PMH significant for moderate persistent asthma, laryngopharyngeal reflux. Patient of Dr. Wynona Neat, last seen on 12/13/20.   Previous [facility] pulmonary encounter:  12/13/20- Dr. Wynona Neat  No significant symptoms today Feels better Trelegy did help Albuterol use about 3 times a week  Still with significant exposure to known triggers  Multiple allergens to cats and dogs Did change flooring but noticed that breathing is worse  She was taking allergy shots for multiple allergies  Asthma is for most of her adult life  She does have a history of reflux-on famotidine History of gastroparesis, fibromyalgia, osteoarthritis, anxiety and depression  We did prescribe Trelegy the last time she was in the office Occupational history-office work  Any kind of activity recently contributes to shortness of breath  Pets: Dogs and cats  Occupation: Office work, grocery  Exposures: New flooring did make symptoms worse  Smoking history: Never smoker, was exposed to a lot of secondhand smoke growing up   09/25/2021- interim hx  Patient presents today for acute OV/ asthma exacerbation. Patient reports increased shortness of breath over the last month. Occasional wheezing. No significant cough or nasal congestion. States that she can not get deep breath. She is ok sitting still. No stamina. Albuterol inhaler has not helped. She was prescribed 50mg  prednisone x5 days from UC, she did not see any improvement until day 4. No taper. She is  currently taking allergy shots . She stopped famotidine for relfux d/t [place] of perceived benefit. Denies f/c/s, cough, chest tightness or leg swelling/weight changes.    No Known Allergies  Immunization History  Administered Date(s) Administered   Influenza Whole 05/16/2020   Influenza, High Dose Seasonal PF 10/10/2019, 10/09/2020   Influenza, Quadrivalent, Recombinant, Inj, Pf 05/06/2019   PFIZER(Purple Top)SARS-COV-2 Vaccination 10/09/2020   Pneumococcal Polysaccharide-23 10/10/2019, 10/09/2020    Past Medical History:  Diagnosis Date   Asthma     Tobacco History: Social History   Tobacco Use  Smoking Status Never  Smokeless Tobacco Never   Counseling given: Not Answered   Outpatient Medications Prior to Visit  Medication Sig Dispense Refill   buPROPion (WELLBUTRIN XL) 300 MG 24 hr tablet TK 1 T PO ONCE QAM  3   diclofenac (VOLTAREN) 50 MG EC tablet TK 1 T PO TID WF OR MILK  3   FLUoxetine (PROZAC) 40 MG capsule TK 1 C PO QD  3   gabapentin (NEURONTIN) 400 MG capsule TK 2 CS PO HS  2   gabapentin (NEURONTIN) 800 MG tablet TK 1 T PO HS  3   levocetirizine (XYZAL) 5 MG tablet TK 1 T PO ONCE QPM  0   levothyroxine (SYNTHROID, LEVOTHROID) 50 MCG tablet TK 1 T PO QD  2   mometasone (NASONEX) 50 MCG/ACT nasal spray      montelukast (SINGULAIR) 10 MG tablet TK 1 T PO ONCE A DAY IN THE EVE  0   traMADol (ULTRAM) 50 MG tablet  trazodone (DESYREL) 300 MG tablet TK 1 T PO HS PRN  11   TRELEGY ELLIPTA 200-62.5-25 MCG/INH AEPB INHALE 1 PUFF INTO THE LUNGS DAILY 60 each 5   valACYclovir (VALTREX) 1000 MG tablet TK 1 T PO TID PRN DURING FLARE  8   fluconazole (DIFLUCAN) 150 MG tablet TK 1 T PO 1 TIME WEEKLY (Patient not taking: Reported on 09/25/2021)  4   No facility-administered medications prior to visit.    Review of Systems  Review of Systems  Constitutional: Negative.   HENT: Negative.    Respiratory:  Positive for shortness of breath and wheezing. Negative for cough  and chest tightness.     Physical Exam  BP 114/80 (BP Location: Left Arm, Patient Position: Sitting, Cuff Size: Normal)    Pulse 89    Temp 98.1 F (36.7 C) (Oral)    Ht 5\' 11"  (1.803 m)    Wt 161 lb 12.8 oz (73.4 kg)    SpO2 99%    BMI 22.57 kg/m  Physical Exam Constitutional:      Appearance: Normal appearance.  HENT:     Head: Normocephalic and atraumatic.     Mouth/Throat:     Mouth: Mucous membranes are moist.     Pharynx: Oropharynx is clear.  Cardiovascular:     Rate and Rhythm: Normal rate and regular rhythm.  Pulmonary:     Effort: Pulmonary effort is normal.     Breath sounds: Wheezing present. No rhonchi or rales.     Comments: Faint wheezing upper airway, otherwise clear.  Musculoskeletal:        General: Normal range of motion.  Skin:    General: Skin is warm and dry.  Neurological:     General: No focal deficit present.     Mental Status: She is alert and oriented to person, place, and time. Mental status is at baseline.  Psychiatric:        Mood and Affect: Mood normal.        Behavior: Behavior normal.        Thought Content: Thought content normal.        Judgment: Judgment normal.     Lab Results:  CBC    Component Value Date/Time   WBC 6.5 05/22/2017 2024   RBC 3.82 (L) 05/22/2017 2024   HGB 11.2 (L) 05/22/2017 2024   HCT 33.9 (L) 05/22/2017 2024   PLT 179 05/22/2017 2024   MCV 88.7 05/22/2017 2024   MCH 29.3 05/22/2017 2024   MCHC 33.0 05/22/2017 2024   RDW 13.2 05/22/2017 2024    BMET    Component Value Date/Time   NA 138 05/22/2017 2024   K 3.4 (L) 05/22/2017 2024   CL 103 05/22/2017 2024   CO2 29 05/22/2017 2024   GLUCOSE 111 (H) 05/22/2017 2024   BUN 15 05/22/2017 2024   CREATININE 0.77 05/22/2017 2024   CALCIUM 9.7 05/22/2017 2024   GFRNONAA >60 05/22/2017 2024   GFRAA >60 05/22/2017 2024    BNP No results found for: BNP  ProBNP No results found for: PROBNP  Imaging: No results found.   Assessment & Plan:    Moderate persistent asthma without complication - Prolonged asthma exacerbation. Temporary improvement with prednisone 50mg  x 5 days. GERD could be contributing. Checking CXR and cbc with diff/IgE today. FENO was 19. Continue Trelegy 2025 one puff daily. Sending in extended prednisone taper 40mg  x 4 days, 30mg  x 4 days, 20mg  x 4 days, 10mg  x 4 days.  If labs and CXR are normal, consider further cardiac work up.   Laryngopharyngeal reflux (LPR) - Advised patient to resume famotidine 20mg  at bedtime    , NP 09/25/2021

## 2021-09-25 NOTE — Patient Instructions (Addendum)
Recommendations: - Continue Trelegy 261mcg- take one puff daily in the morning - Continue Xyzal and Singulair  - Resume famotidine 20mg  at bedtime and follow-up with GI   Rx: - Prednisone taper as directed   Orders: - Labs and CXR (ordered)  - FENO   Follow-up: - 2 weeks with Eustaquio Maize NP

## 2021-09-26 LAB — IGE: IgE (Immunoglobulin E), Serum: 123 kU/L — ABNORMAL HIGH (ref <=114)

## 2021-09-26 NOTE — Progress Notes (Signed)
IgE was elevated, I believe she follows with allergy when is she due to see them next?

## 2021-09-26 NOTE — Progress Notes (Signed)
Please let patient know labs were normal, waiting for one for allergy test I[facility] to come back. WBC and eosinophils were normal. No evidence of heart failure or fluid retention

## 2021-10-10 ENCOUNTER — Encounter: Payer: Self-pay | Admitting: Primary Care

## 2021-10-10 ENCOUNTER — Other Ambulatory Visit: Payer: Self-pay

## 2021-10-10 ENCOUNTER — Ambulatory Visit: Payer: BC Managed Care – PPO | Admitting: Primary Care

## 2021-10-10 VITALS — BP 116/60 | HR 78 | Temp 98.1°F | Ht 71.0 in | Wt 164.8 lb

## 2021-10-10 DIAGNOSIS — J45909 Unspecified asthma, uncomplicated: Secondary | ICD-10-CM | POA: Diagnosis not present

## 2021-10-10 DIAGNOSIS — J4540 Moderate persistent asthma, uncomplicated: Secondary | ICD-10-CM | POA: Diagnosis not present

## 2021-10-10 NOTE — Progress Notes (Signed)
? ?@Patient  ID: Rachel Fuentes, female    DOB: 02-Oct-1960, 61 y.o.   MRN: [id number] ? ?Chief Complaint  ?Patient presents with  ? Follow-up  ?  Follow up. Patient says everything is going better.   ? ? ?Referring provider: ?No ref. provider found ? ?HPI: ?61 year old female, never smoked. PMH significant for moderate persistent asthma, laryngopharyngeal reflux. Patient of Dr. Wynona Neat.  ? ?Previous [facility] pulmonary encounter:  ?12/13/20- Dr. Wynona Neat  ?No significant symptoms today ?Feels better ?Trelegy did help ?Albuterol use about 3 times a week ? ?Still with significant exposure to known triggers ? ?Multiple allergens to cats and dogs ?Did change flooring but noticed that breathing is worse ? ?She was taking allergy shots for multiple allergies ? ?Asthma is for most of her adult life ? ?She does have a history of reflux-on famotidine ?History of gastroparesis, fibromyalgia, osteoarthritis, anxiety and depression ? ?We did prescribe Trelegy the last time she was in the office ?Occupational history-office work ? ?Any kind of activity recently contributes to shortness of breath ? ?Pets: Dogs and cats ? ?Occupation: Paramedic work, grocery ? ?Exposures: New flooring did make symptoms worse ? ?Smoking history: Never smoker, was exposed to a lot of secondhand smoke growing up ? ?09/25/2021 ?Patient presents today for acute OV/ asthma exacerbation. Patient reports increased shortness of breath over the last month. Occasional wheezing. No significant cough or nasal congestion. States that she can not get deep breath. She is ok sitting still. No stamina. Albuterol inhaler has not helped. She was prescribed 50mg  prednisone x5 days from UC, she did not see any improvement until day 4. No taper. She is currently taking allergy shots . She stopped famotidine for relfux d/t [place] of perceived benefit. Denies f/c/s, cough, chest tightness or leg swelling/weight changes.  ? ?10/10/2021 - interim hx  ?She is doing much better since  last visit. Her asthma symptoms are typically well controlled. She always has a little chest congestion. She responded well to recent prednisone taper. She is taking Trelegy . She saw allergy this morning. They started her on mometasone nasal spray and prn nebulizer. She gets allergy shots once a week. She has allergies to dust mites, roaches, mold, cats and dogs. She has pets at home, states that they are no longer sleeping in her bedroom.  ? ?No Known Allergies ? ?Immunization History  ?Administered Date(s) Administered  ? Influenza Whole 05/16/2020  ? Influenza, High Dose Seasonal PF 10/10/2019, 10/09/2020  ? Influenza, Quadrivalent, Recombinant, Inj, Pf 05/06/2019  ? PFIZER(Purple Top)SARS-COV-2 Vaccination 10/09/2020  ? Pneumococcal Polysaccharide-23 10/10/2019, 10/09/2020  ? ? ?Past Medical History:  ?Diagnosis Date  ? Asthma   ? ? ?Tobacco History: ?Social History  ? ?Tobacco Use  ?Smoking Status Never  ?Smokeless Tobacco Never  ? ?Counseling given: Not Answered ? ? ?Outpatient Medications Prior to Visit  ?Medication Sig Dispense Refill  ? buPROPion (WELLBUTRIN XL) 300 MG 24 hr tablet TK 1 T PO ONCE QAM  3  ? diclofenac (VOLTAREN) 50 MG EC tablet TK 1 T PO TID WF OR MILK  3  ? FLUoxetine (PROZAC) 40 MG capsule TK 1 C PO QD  3  ? gabapentin (NEURONTIN) 400 MG capsule TK 2 CS PO HS  2  ? gabapentin (NEURONTIN) 800 MG tablet TK 1 T PO HS  3  ? levocetirizine (XYZAL) 5 MG tablet TK 1 T PO ONCE QPM  0  ? levothyroxine (SYNTHROID, LEVOTHROID) 50 MCG tablet TK 1 T PO  QD  2  ? mometasone (NASONEX) 50 MCG/ACT nasal spray     ? montelukast (SINGULAIR) 10 MG tablet TK 1 T PO ONCE A DAY IN THE EVE  0  ? predniSONE (DELTASONE) 10 MG tablet 4 tabs for 4 days, then 3 tabs for 4 days, 2 tabs for 4 days, then 1 tab for 4 days, then stop 40 tablet 0  ? traMADol (ULTRAM) 50 MG tablet     ? trazodone (DESYREL) 300 MG tablet TK 1 T PO HS PRN  11  ? TRELEGY ELLIPTA 200-62.5-25 MCG/INH AEPB INHALE 1 PUFF INTO THE LUNGS DAILY  60 each 5  ? valACYclovir (VALTREX) 1000 MG tablet TK 1 T PO TID PRN DURING FLARE  8  ? ?No facility-administered medications prior to visit.  ? ?Review of Systems ? ?Review of Systems  ?Constitutional: Negative.   ?HENT:  Positive for congestion.   ?Respiratory:  Positive for cough. Negative for chest tightness and wheezing.   ?Cardiovascular: Negative.   ? ? ?Physical Exam ? ?BP 116/60 (BP Location: Left Arm, Patient Position: Sitting, Cuff Size: Normal)   Pulse 78   Temp 98.1 ?F (36.7 ?C) (Oral)   Ht 5\' 11"  (1.803 m)   Wt 164 lb 12.8 oz (74.8 kg)   SpO2 98%   BMI 22.98 kg/m?  ?Physical Exam ?Constitutional:   ?   Appearance: Normal appearance.  ?HENT:  ?   Head: Normocephalic and atraumatic.  ?   Mouth/Throat:  ?   Mouth: Mucous membranes are moist.  ?   Pharynx: Oropharynx is clear.  ?Cardiovascular:  ?   Rate and Rhythm: Normal rate and regular rhythm.  ?Pulmonary:  ?   Effort: Pulmonary effort is normal.  ?   Breath sounds: Normal breath sounds. No wheezing, rhonchi or rales.  ?Musculoskeletal:     ?   General: Normal range of motion.  ?Skin: ?   General: Skin is warm and dry.  ?Neurological:  ?   General: No focal deficit present.  ?   Mental Status: She is alert and oriented to person, place, and time. Mental status is at baseline.  ?Psychiatric:     ?   Mood and Affect: Mood normal.     ?   Behavior: Behavior normal.     ?   Thought Content: Thought content normal.     ?   Judgment: Judgment normal.  ?  ? ?Lab Results: ? ?CBC ?   ?Component Value Date/Time  ? WBC 7.1 09/25/2021 1110  ? RBC 3.98 09/25/2021 1110  ? HGB 12.5 09/25/2021 1110  ? HCT 36.3 09/25/2021 1110  ? PLT 248.0 09/25/2021 1110  ? MCV 91.2 09/25/2021 1110  ? MCH 29.3 05/22/2017 2024  ? MCHC 34.3 09/25/2021 1110  ? RDW 12.8 09/25/2021 1110  ? LYMPHSABS 2.7 09/25/2021 1110  ? MONOABS 0.6 09/25/2021 1110  ? EOSABS 0.1 09/25/2021 1110  ? BASOSABS 0.1 09/25/2021 1110  ? ? ?BMET ?   ?Component Value Date/Time  ? NA 138 05/22/2017 2024  ? K  3.4 (L) 05/22/2017 2024  ? CL 103 05/22/2017 2024  ? CO2 29 05/22/2017 2024  ? GLUCOSE 111 (H) 05/22/2017 2024  ? BUN 15 05/22/2017 2024  ? CREATININE 0.77 05/22/2017 2024  ? CALCIUM 9.7 05/22/2017 2024  ? GFRNONAA >60 05/22/2017 2024  ? GFRAA >60 05/22/2017 2024  ? ? ?BNP ?No results found for: BNP ? ?ProBNP ?   ?Component Value Date/Time  ? PROBNP 25.0 09/25/2021  1110  ? ? ?Imaging: ?DG Chest 2 View ? ?Result Date: 09/25/2021 ?CLINICAL DATA:  Shortness of breath EXAM: CHEST - 2 VIEW COMPARISON:  Chest x-ray 06/28/2020 FINDINGS: Heart size and mediastinal contours are within normal limits. No suspicious pulmonary opacities identified. Hyperinflated lungs. No pleural effusion or pneumothorax visualized. No acute osseous abnormality appreciated. IMPRESSION: No focal consolidative identified.  Hyperinflated lungs. Electronically Signed   By: Jannifer Hick M.D.   On: 09/25/2021 11:23   ? ? ?Assessment & Plan:  ? ?Moderate persistent asthma without complication ?- Treated for acute exacerbation in February with prednisone taper (40mg  x 3 days; 30mg  x 3 days; 20mg  x 3 days; 10mg  x 3 days). Responded well to this. Typically her symptoms are well controlled except for slight chest congestion. She is maintained on Trelegy one puff daily, Xyzal and Singulair. She has allergens to dust mites, roaches, mold, dogs, cats. She follows with allergy and is receiving weekly allergy shots. IgE 123, EOS 100. If having >2 flare ups annually requiring oral prednisone can consider adding wither Xolair or Tezspire. FU in 4 months or sooner if needed.  ? ? ? ? ? , NP ?10/10/2021 ? ?

## 2021-10-10 NOTE — Patient Instructions (Signed)
Recommendations: ?Continue Trelegy one puff daily ?Continue Singulair and Xyzal ?Start new nasal spray per allergy (looks like they prescribed mometasone) ?Call if you notice increased shortness of breath/wheezing  ?Prednisone taper that works well for you was (40mg  x 4 days; 30mg  x 4 days; 20mg  x 4 days; 10mg  x 4 days) ? ?Orders: ?Incentive spirometer  ? ?Follow-up: ?4 month follow-up with Dr. ? ? ?

## 2021-10-10 NOTE — Assessment & Plan Note (Signed)
-   Treated for acute exacerbation in February with prednisone taper (40mg  x 3 days; 30mg  x 3 days; 20mg  x 3 days; 10mg  x 3 days). Responded well to this. Typically her symptoms are well controlled except for slight chest congestion. She is maintained on Trelegy 261mcg one puff daily, Xyzal and Singulair. She has allergens to dust mites, roaches, mold, dogs, cats. She follows with allergy and is receiving weekly allergy shots. IgE 123, EOS 100. If having >2 flare ups annually requiring oral prednisone can consider adding wither Xolair or Tezspire. FU in 4 months or sooner if needed.  ?

## 2021-11-10 ENCOUNTER — Encounter (HOSPITAL_BASED_OUTPATIENT_CLINIC_OR_DEPARTMENT_OTHER): Payer: Self-pay | Admitting: Cardiology

## 2021-11-10 ENCOUNTER — Ambulatory Visit (HOSPITAL_BASED_OUTPATIENT_CLINIC_OR_DEPARTMENT_OTHER): Payer: BC Managed Care – PPO | Admitting: Cardiology

## 2021-11-10 VITALS — BP 120/77 | HR 74 | Ht 71.0 in | Wt 166.2 lb

## 2021-11-10 DIAGNOSIS — J4540 Moderate persistent asthma, uncomplicated: Secondary | ICD-10-CM | POA: Diagnosis not present

## 2021-11-10 DIAGNOSIS — Z7189 Other specified counseling: Secondary | ICD-10-CM

## 2021-11-10 DIAGNOSIS — E785 Hyperlipidemia, unspecified: Secondary | ICD-10-CM

## 2021-11-10 DIAGNOSIS — R0602 Shortness of breath: Secondary | ICD-10-CM

## 2021-11-10 DIAGNOSIS — Z8249 Family history of ischemic heart disease and other diseases of the circulatory system: Secondary | ICD-10-CM

## 2021-11-10 NOTE — Patient Instructions (Signed)
Medication Instructions:  ?Your Physician recommend you continue on your current medication as directed.   ? ?*If you need a refill on your cardiac medications before your next appointment, please call your pharmacy* ? ? ?Lab Work: ?None ordered today ? ? ?Testing/Procedures: ?Your physician has requested that you have an echocardiogram. Echocardiography is a painless test that uses sound waves to create images of your heart. It provides your doctor with information about the size and shape of your heart and how well your heart?s chambers and valves are working. This procedure takes approximately one hour. There are no restrictions for this procedure. ?[address] ? ?CT coronary calcium score.  ? ?Test locations:  ?[facility] [place], [place] [address])  ? ?This is $99 out of pocket. ? ? ?Coronary CalciumScan ?A coronary calcium scan is an imaging test used to look for deposits of calcium and other fatty materials (plaques) in the inner lining of the blood vessels of the heart (coronary arteries). These deposits of calcium and plaques can partly clog and narrow the coronary arteries without producing any symptoms or warning signs. This puts a person at risk for a heart attack. This test can detect these deposits before symptoms develop. ?Tell a health care provider about: ?Any allergies you have. ?All medicines you are taking, including vitamins, herbs, eye drops, creams, and over-the-counter medicines. ?Any problems you or family members have had with anesthetic medicines. ?Any blood disorders you have. ?Any surgeries you have had. ?Any medical conditions you have. ?Whether you are pregnant or may be pregnant. ?What are the risks? ?Generally, this is a safe procedure. However, problems may occur, including: ?Harm to a pregnant woman and her unborn baby. This test involves the use of radiation. Radiation exposure can be dangerous to a pregnant woman and her unborn  baby. If you are pregnant, you generally should not have this procedure done. ?Slight increase in the risk of cancer. This is because of the radiation involved in the test. ?What happens before the procedure? ?No preparation is needed for this procedure. ?What happens during the procedure? ?You will undress and remove any jewelry around your neck or chest. ?You will put on a hospital gown. ?Sticky electrodes will be placed on your chest. The electrodes will be connected to an electrocardiogram (ECG) machine to record a tracing of the electrical activity of your heart. ?A CT scanner will take pictures of your heart. During this time, you will be asked to lie still and hold your breath for 2-3 seconds while a picture of your heart is being taken. ?The procedure may vary among health care providers and hospitals. ?What happens after the procedure? ?You can get dressed. ?You can return to your normal activities. ?It is up to you to get the results of your test. Ask your health care provider, or the department that is doing the test, when your results will be ready. ?Summary ?A coronary calcium scan is an imaging test used to look for deposits of calcium and other fatty materials (plaques) in the inner lining of the blood vessels of the heart (coronary arteries). ?Generally, this is a safe procedure. Tell your health care provider if you are pregnant or may be pregnant. ?No preparation is needed for this procedure. ?A CT scanner will take pictures of your heart. ?You can return to your normal activities after the scan is done. ?This information is not intended to replace advice given to you by your health care provider. Make  sure you discuss any questions you have with your health care provider. ?Document Released: 01/23/2008 Document Revised: 06/15/2016 Document Reviewed: 06/15/2016 ?Elsevier Interactive Patient Education ? 2017 [employer] ? ? ? ?Follow-Up: ?At [facility], you and your health needs are our  priority.  As part of our continuing mission to provide you with exceptional heart care, we have created designated Provider Care Teams.  These Care Teams include your primary Cardiologist (physician) and Advanced Practice Providers (APPs -  Physician Assistants and Nurse Practitioners) who all work together to provide you with the care you need, when you need it. ? ?We recommend signing up for the patient portal called "MyChart".  Sign up information is provided on this After Visit Summary.  MyChart is used to connect with patients for Virtual Visits (Telemedicine).  Patients are able to view lab/test results, encounter notes, upcoming appointments, etc.  Non-urgent messages can be sent to your provider as well.   ?To learn more about what you can do with MyChart, go to [email or web address].   ? ?Your next appointment:   ?Based on test results ? ?The format for your next appointment:   ?In Person ? ?Provider:   ?Jodelle Red, MD{ ? ? ? ?

## 2021-11-10 NOTE — Progress Notes (Signed)
?Cardiology Office Note:   ? ?Date:  11/10/2021  ? ?ID:  Rachel Fuentes, DOB 18-Jun-1961, MRN [id number] ? ?PCP:  Pcp, No  ?Cardiologist:  Jodelle Red, MD ? ?Referring MD: Farris Has, MD  ? ?CC: new patient consultation for shortness of breath and assessment of cardiovascular risk ? ?History of Present Illness:   ? ?Rachel Fuentes is a 61 y.o. female with a hx of moderate persistent asthma, allergies, family history of heart disease who is seen as a new consult at the request of Farris Has, MD for the evaluation and management of shortness of breath. ? ?Note from 07/28/21 with Dr. Paulino Rily reviewed. Primary concern was UTI. Note from 03/04/21 reviewed with Dr. Hyman Hopes, primary topic was annual wellness exam. Also reviewed notes from Dr. Wynona Neat (pulmonology) from 12/13/20 and most recently by Ames Dura, NP (pulmonology). Noted at that visit to have improving symptoms with treatment of her moderate persistent asthma. Also noted to have history of laryngopharyngeal reflux and allergies. Recommended to see cardiology per referral history from 10/21/21. ? ?Recently she had a flare-up of asthma lasting for a couple weeks. She also presents today as this caused her to be concerned for her cardiac health. ? ?Cardiovascular risk factors: ?Prior clinical ASCVD: None. ?Comorbid conditions: Hyperlipidemia - She has never been on medication for this. Most recent lab work in 02/2021. ?Chronic inflammatory conditions: requires steroids for her asthma several times/year ?Tobacco use history: Never. ?Family history: Her father had 3 heart attacks, died due to the 3rd heart attack. He was also a known chain smoker. ?Prior cardiac testing and/or incidental findings on other testing (ie coronary calcium): none ?Exercise level: Her formal exercise is significantly limited by her asthma and shortness of breath. Occasionally she suffers from costochondritis. Also she complains of arthritis in her right knee that  has caused previous mechanical falls. ?Current diet: Eating "junk" lately including fast foods. Fruit and vegetable smoothies every day. Recently her husband started a vegan diet. She is cooking for both of them and will eat meats including chicken. ? ?Previously she was on a decongestant and developed side effects of near syncope. ? ?Consistently, she needs to take prednisone packs a couple times a year for at least 7 years. ? 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 ? ?She denies any palpitations, chest pain, or peripheral edema. No lightheadedness, headaches, orthopnea, or PND. ? ?Past Medical History:  ?Diagnosis Date  ? Asthma   ? ? ?History reviewed. No pertinent surgical history. ? ?Current Medications: ?Current Outpatient Medications on File Prior to Visit  ?Medication Sig  ? buPROPion (WELLBUTRIN XL) 300 MG 24 hr tablet TK 1 T PO ONCE QAM  ? diclofenac (VOLTAREN) 50 MG EC tablet TK 1 T PO TID WF OR MILK  ? FLUoxetine (PROZAC) 40 MG capsule TK 1 C PO QD  ? gabapentin (NEURONTIN) 400 MG capsule TK 2 CS PO HS  ? gabapentin (NEURONTIN) 800 MG tablet TK 1 T PO HS  ? levocetirizine (XYZAL) 5 MG tablet TK 1 T PO ONCE QPM  ? levothyroxine (SYNTHROID, LEVOTHROID) 50 MCG tablet TK 1 T PO QD  ? mometasone (NASONEX) 50 MCG/ACT nasal spray   ? montelukast (SINGULAIR) 10 MG tablet TK 1 T PO ONCE A DAY IN THE EVE  ? traMADol (ULTRAM) 50 MG tablet   ? trazodone (DESYREL) 300 MG tablet TK 1 T PO HS PRN  ? TRELEGY ELLIPTA 200-62.5-25 MCG/INH AEPB INHALE 1 PUFF INTO THE LUNGS  DAILY  ? valACYclovir (VALTREX) 1000 MG tablet TK 1 T PO TID PRN DURING FLARE  ? ?No current facility-administered medications on file prior to visit.  ?  ? ?Allergies:   Patient has no known allergies.  ? ?Social History  ? ?Tobacco Use  ? Smoking status: Never  ? Smokeless tobacco: Never  ? ? ?Family History: ?family history is negative for Breast cancer. ? ?ROS:   ?Please see the history of present illness.  Additional  pertinent ROS: ?Constitutional: Negative for chills, fever, night sweats, unintentional weight loss  ?HENT: Negative for ear pain and hearing loss. Positive for acid reflux. ?Eyes: Negative for loss of vision and eye pain.  ?Respiratory: Negative for cough, sputum, wheezing. Positive for shortness of breath. ?Cardiovascular: See HPI. ?Gastrointestinal: Negative for abdominal pain, melena, and hematochezia.  ?Genitourinary: Negative for dysuria and hematuria.  ?Musculoskeletal: Negative for falls and myalgias.  ?Skin: Negative for itching and rash.  ?Neurological: Negative for focal weakness, focal sensory changes and loss of consciousness.  ?Endo/Heme/Allergies: Does not bruise/bleed easily.   ? ? ?EKGs/Labs/Other Studies Reviewed:   ? ?The following studies were reviewed today: ? ?No prior cardiovascular studies available. ? ?EKG:  EKG is personally reviewed.   ?11/10/2021 NSR at 74 bpm, iRBBB, LVH ? ?Recent Labs: ?09/25/2021: Hemoglobin 12.5; Platelets 248.0; Pro B Natriuretic peptide (BNP) 25.0  ? ?Recent Lipid Panel ?No results found for: CHOL, TRIG, HDL, CHOLHDL, VLDL, LDLCALC, LDLDIRECT ? ?Physical Exam:   ? ?VS:  BP 120/77   Pulse 74   Ht 5\' 11"  (1.803 m)   Wt 166 lb 3.2 oz (75.4 kg)   SpO2 97%   BMI 23.18 kg/m?    ? ?Wt Readings from Last 3 Encounters:  ?11/10/21 166 lb 3.2 oz (75.4 kg)  ?10/10/21 164 lb 12.8 oz (74.8 kg)  ?09/25/21 161 lb 12.8 oz (73.4 kg)  ?  ?GEN: Well nourished, well developed in no acute distress ?HEENT: Normal, moist mucous membranes ?NECK: No JVD ?CARDIAC: regular rhythm, normal S1 and S2, no rubs or gallops. No murmur. ?VASCULAR: Radial and DP pulses 2+ bilaterally. No carotid bruits ?RESPIRATORY:  Clear to auscultation with mildly prolonged expiratory phase, without rales, wheezing or rhonchi  ?ABDOMEN: Soft, non-tender, non-distended ?MUSCULOSKELETAL:  Ambulates independently ?SKIN: Warm and dry, no edema ?NEUROLOGIC:  Alert and oriented x 3. No focal neuro deficits  noted. ?PSYCHIATRIC:  Normal affect   ? ?ASSESSMENT:   ? ?1. Shortness of breath   ?2. Family history of heart disease   ?3. Cardiac risk counseling   ?4. Moderate persistent asthma without complication   ?5. Hyperlipidemia, unspecified hyperlipidemia type   ? ?PLAN:   ? ?Shortness of breath: ?-multiple possible causes, including her asthma ?-with her family history and longstanding asthma, echo will be useful in excluding structural heart etiology as well as possibly measuring right heart  ? ?Borderline hypercholesterolemia ?Lipids per [facility] 03/04/21: Tchol 188, HDL 60, LDL 109. TG 109 ? ?Family history of heart disease:  ?-we reviewed the pros/cons of calcium scores. A cardiac CT scan for coronary calcium is a non-invasive way of obtaining information about the presence, location and extent of calcified plaque in the coronary arteries--the vessels that supply oxygen-containing blood to the heart muscle. Calcified plaque results when there is a build-up of fat and other substances under the inner layer of the artery. This material can calcify which signals the presence of atherosclerosis, a disease of the vessel wall, also called coronary artery disease.  People with  this disease have an increased risk for heart attacks. In addition, over time, progression of plaque build up (CAD) can narrow the arteries or even close off blood flow to the heart. Because calcium is a marker of CAD, the amount of calcium detected on a cardiac CT scan is a helpful prognostic tool.  ?-we reviewed the charts together which show the relationship between calcium score and 15 year all cause mortality ?-after shared decision making, will proceed with coronary calcium score. They understand this is an out of pocket/self pay test currently costing $99.  ? ?Cardiac risk counseling and prevention recommendations: ?-recommend heart healthy/Mediterranean diet, with whole grains, fruits, vegetable, fish, lean meats, nuts, and olive oil. Limit  salt. ?-recommend moderate walking, 3-5 times/week for 30-50 minutes each session. Aim for at least 150 minutes.week. Goal should be pace of 3 miles/hours, or walking 1.5 miles in 30 minutes ?-recommend avoidance of tobacco pr

## 2021-11-14 ENCOUNTER — Ambulatory Visit (HOSPITAL_BASED_OUTPATIENT_CLINIC_OR_DEPARTMENT_OTHER): Payer: BC Managed Care – PPO

## 2021-11-17 ENCOUNTER — Ambulatory Visit (HOSPITAL_BASED_OUTPATIENT_CLINIC_OR_DEPARTMENT_OTHER)
Admission: RE | Admit: 2021-11-17 | Discharge: 2021-11-17 | Disposition: A | Discharge status: Home / Self Care | Payer: BC Managed Care – PPO | Source: Ambulatory Visit | Attending: Cardiology | Admitting: Cardiology

## 2021-11-17 ENCOUNTER — Encounter (HOSPITAL_BASED_OUTPATIENT_CLINIC_OR_DEPARTMENT_OTHER): Payer: Self-pay

## 2021-11-17 DIAGNOSIS — Z8249 Family history of ischemic heart disease and other diseases of the circulatory system: Secondary | ICD-10-CM | POA: Insufficient documentation

## 2021-11-24 ENCOUNTER — Ambulatory Visit (INDEPENDENT_AMBULATORY_CARE_PROVIDER_SITE_OTHER): Payer: BC Managed Care – PPO

## 2021-11-24 ENCOUNTER — Encounter (HOSPITAL_BASED_OUTPATIENT_CLINIC_OR_DEPARTMENT_OTHER): Payer: Self-pay

## 2021-11-24 DIAGNOSIS — R0602 Shortness of breath: Secondary | ICD-10-CM

## 2021-11-24 LAB — ECHOCARDIOGRAM COMPLETE
AR max vel: 2.89 cm2
AV Area VTI: 2.97 cm2
AV Area mean vel: 2.75 cm2
AV Mean grad: 3 mmHg
AV Peak grad: 5.7 mmHg
AV Vena cont: 0.27 cm
Ao pk vel: 1.19 m/s
Area-P 1/2: 3.6 cm2
Calc EF: 62.7 %
S' Lateral: 2.27 cm
Single Plane A2C EF: 61.9 %
Single Plane A4C EF: 60.7 %

## 2021-11-25 NOTE — Telephone Encounter (Signed)
Please advise 

## 2021-11-28 ENCOUNTER — Telehealth: Payer: Self-pay | Admitting: Cardiology

## 2021-11-28 NOTE — Telephone Encounter (Signed)
Patient calling for her CT and echo results. She states she has not heard anything in a week and she is scared by the results. ?

## 2021-11-28 NOTE — Telephone Encounter (Signed)
Please advise 

## 2021-11-29 NOTE — Telephone Encounter (Signed)
See results notes, done

## 2021-12-01 NOTE — Telephone Encounter (Signed)
Pt made aware via my chart

## 2022-01-04 ENCOUNTER — Other Ambulatory Visit: Payer: Self-pay | Admitting: Pulmonary Disease

## 2022-01-04 DIAGNOSIS — J4540 Moderate persistent asthma, uncomplicated: Secondary | ICD-10-CM

## 2022-03-11 ENCOUNTER — Other Ambulatory Visit: Payer: Self-pay | Admitting: Family Medicine

## 2022-03-11 DIAGNOSIS — Z1231 Encounter for screening mammogram for malignant neoplasm of breast: Secondary | ICD-10-CM

## 2022-04-06 ENCOUNTER — Ambulatory Visit
Admission: RE | Admit: 2022-04-06 | Discharge: 2022-04-06 | Disposition: A | Discharge status: Home / Self Care | Payer: BC Managed Care – PPO | Source: Ambulatory Visit | Attending: Family Medicine | Admitting: Family Medicine

## 2022-04-06 DIAGNOSIS — Z1231 Encounter for screening mammogram for malignant neoplasm of breast: Secondary | ICD-10-CM

## 2022-05-24 IMAGING — CT CT CARDIAC CORONARY ARTERY CALCIUM SCORE
3 series · 14 of 20 positions shown, 16 images · non-contrast
Comparison: None.
COMPARISON: None.

Addendum:
EXAM:
OVER-READ INTERPRETATION  CT CHEST

The following report is an over-read performed by radiologist Dr.
India Moto [REDACTED] on 11/17/2021. This
over-read does not include interpretation of cardiac or coronary
anatomy or pathology. The coronary calcium score/coronary CTA
interpretation by the cardiologist is attached.
CLINICAL DATA: Cardiovascular disease risk stratification
Family history of CAD
CT Coronary Calcium Score
TECHNIQUE: A gated, non-contrast computed tomography scan of the heart was
performed using 3mm slice thickness. Axial images were analyzed on a
dedicated workstation. Calcium scoring of the coronary arteries was
performed using the Agatston method.

[Series 2: ax lung · axial · 0.73mm/px · z∈[+79,+191]mm · 5 of 86 slices shown]
[im 15/86  lung]
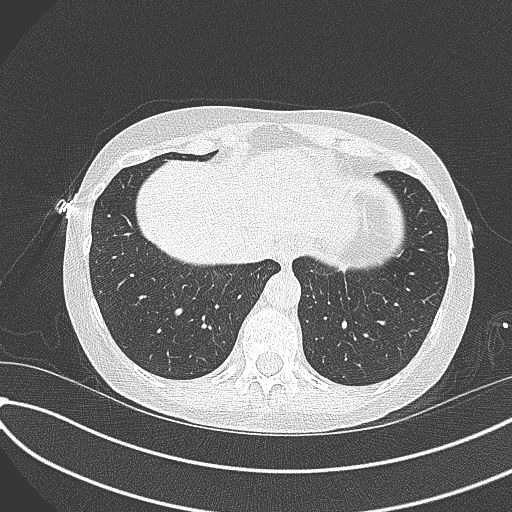
[im 29/86  lung]
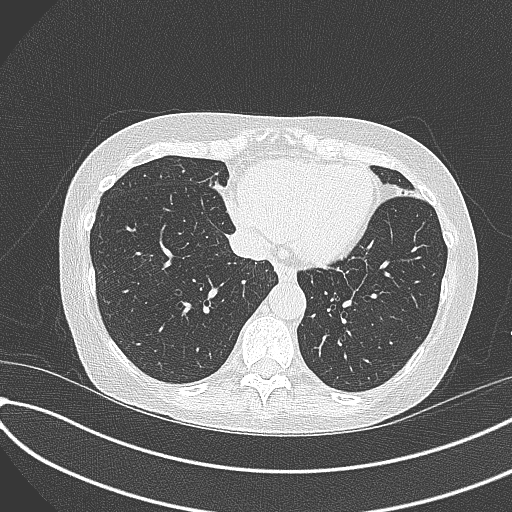
[im 43/86  lung]
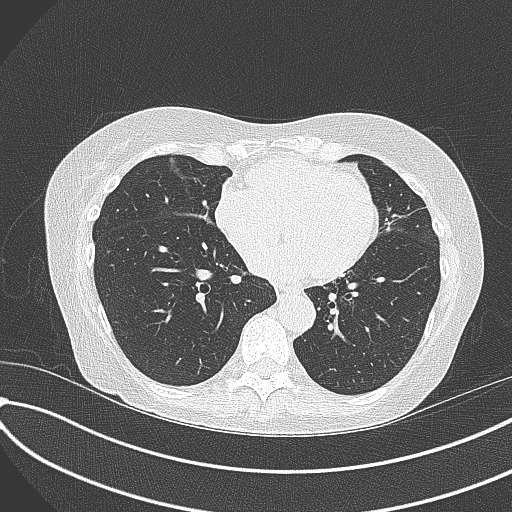
[im 57/86  lung]
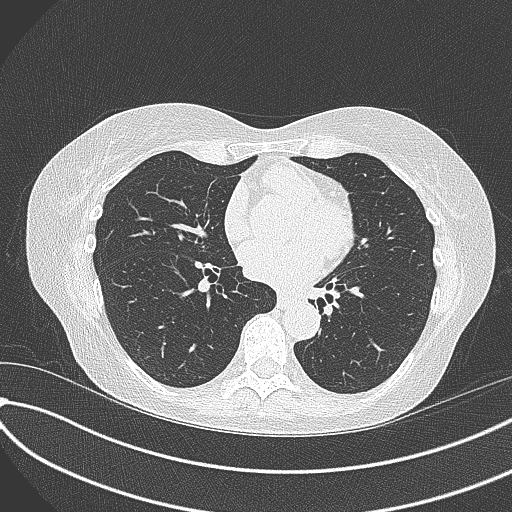
[im 71/86  lung]
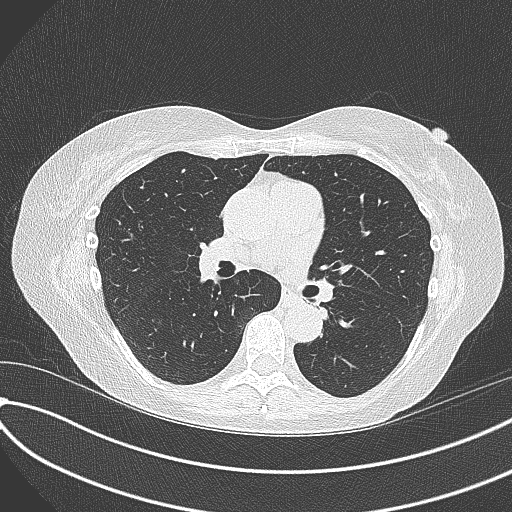

[Series 3: cascseq 3.0 sa36 70% (id) · axial · 0.39mm/px · z∈[+94,+178]mm · 3 of 57 slices shown]
[im 15/57  vessel]
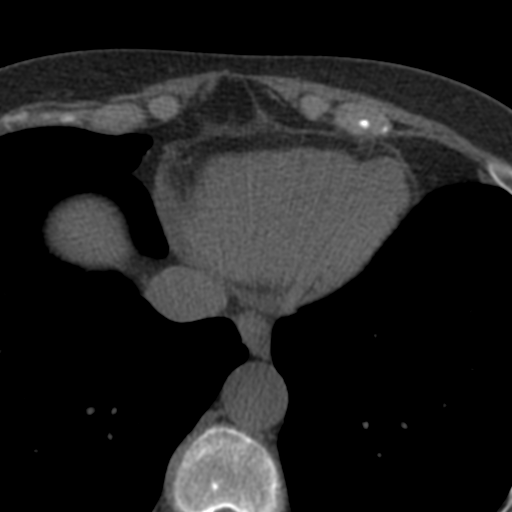
[im 29/57  vessel]
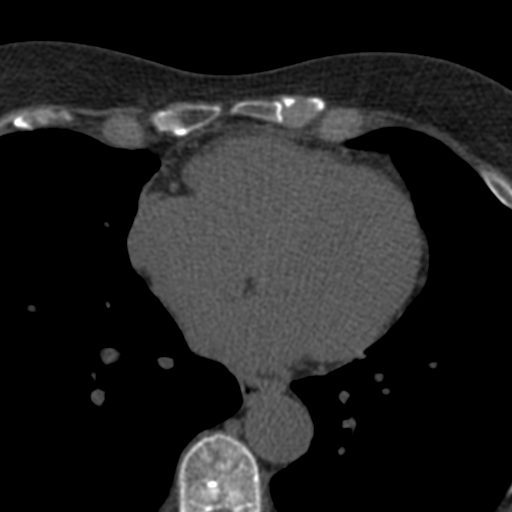
[im 43/57  vessel]
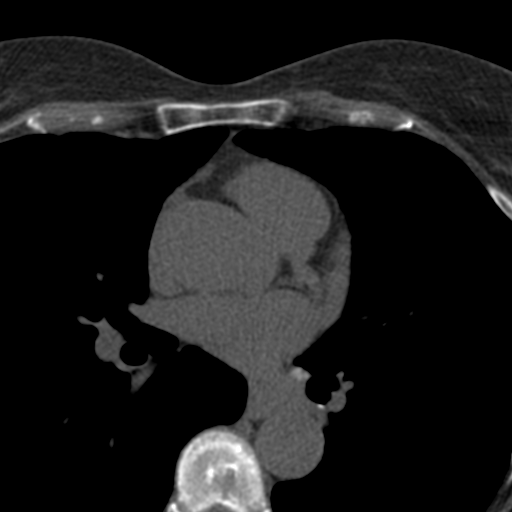

[Series 4: ax st · axial · 0.66mm/px · z∈[+75,+195]mm · 6 of 86 slices shown, 8 images]
[im 13/86  vessel]
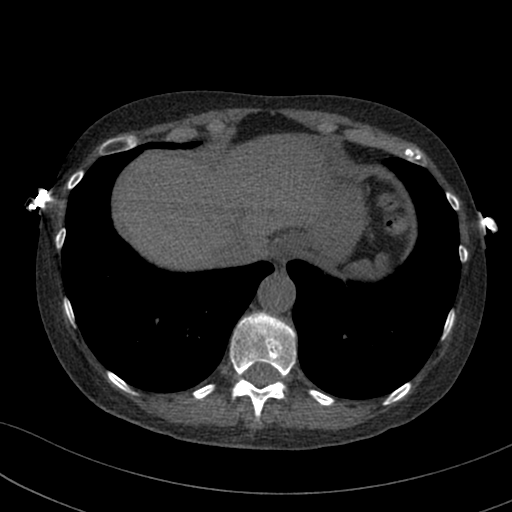
[im 13/86  lung]
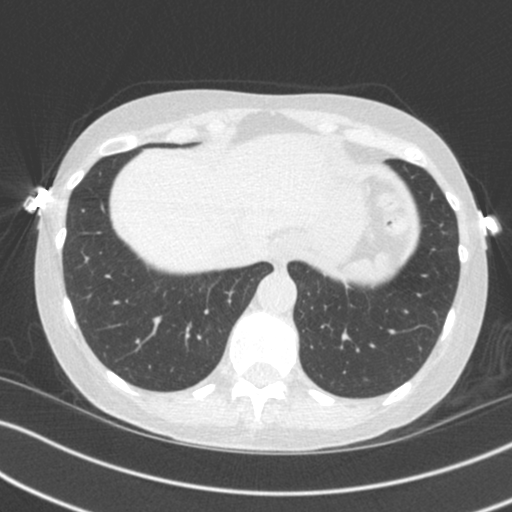
[im 25/86  vessel]
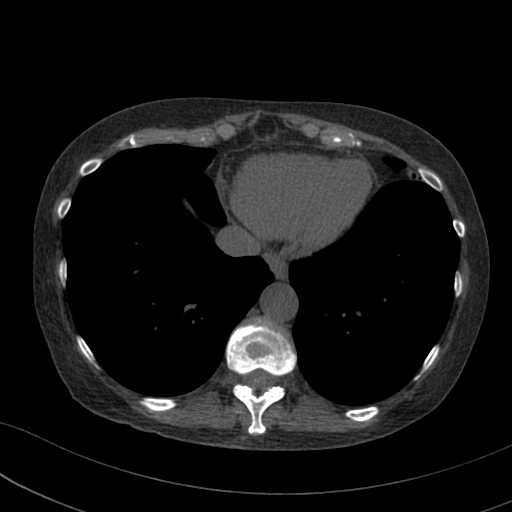
[im 37/86  vessel]
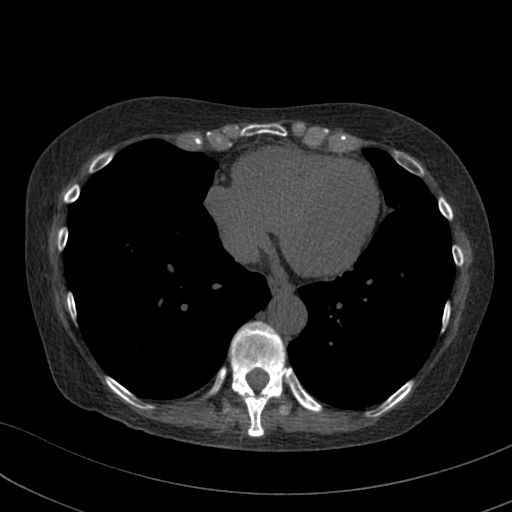
[im 49/86  vessel]
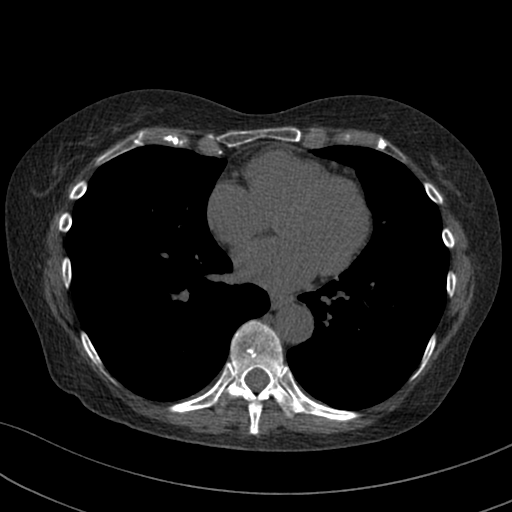
[im 61/86  vessel]
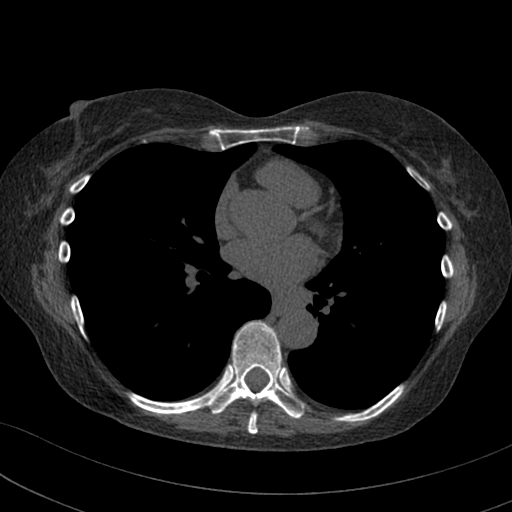
[im 61/86  lung]
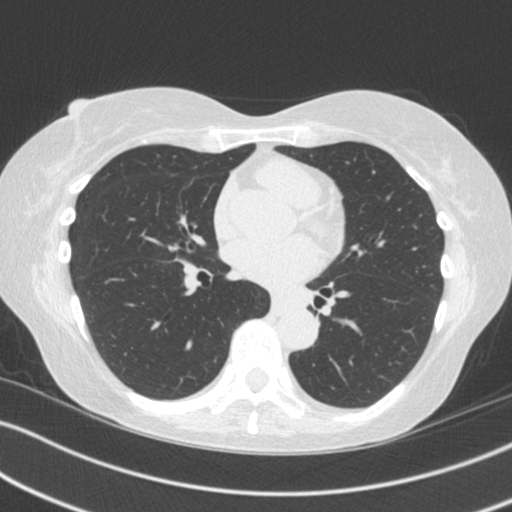
[im 73/86  vessel]
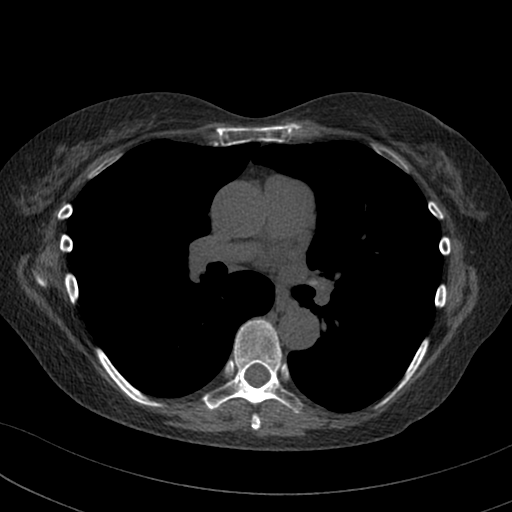

[14 of 20 positions shown; findings below may reference images not displayed]

FINDINGS: Vascular: Normal heart size. No pericardial effusion. Normal caliber
thoracic aorta with no evidence of atherosclerotic disease.

Mediastinum/Nodes: Esophagus is unremarkable. Calcified left hilar
lymph nodes, likely sequela of prior granulomatous infection. No
pathologically enlarged lymph nodes seen in the chest.

Lungs/Pleura: Central airways are patent. No consolidation, pleural
effusion or pneumothorax. Linear opacities of the lingula with
associated bronchiectasis, likely sequela of prior and affection.
Calcified nodule of the left lower lobe, compatible with granuloma.
Small ground-glass nodule of the right upper lobe measuring 5 mm on
series 2, image 4.

Upper Abdomen: No acute abnormality.

Musculoskeletal: No chest wall mass or suspicious bone lesions
identified.
IMPRESSION: 1. No acute extracardiac findings.
2. Small ground-glass nodule of the right upper lobe measuring 5 mm.
No follow-up recommended. This recommendation follows the consensus
statement: Guidelines for Management of Incidental Pulmonary Nodules
Detected on CT Images: From the [HOSPITAL] 4351; Radiology
FINDINGS: Coronary Calcium Score:

Left main: 0

Left anterior descending artery: 0

Left circumflex artery: 0

Right coronary artery: 0

Total: 0

Pericardium: Normal.

Ascending Aorta: Normal caliber. Ascending aorta measures
approximately 36mm at the mid ascending aorta measured in an axial
plane.

Non-cardiac: See separate report from [REDACTED].
IMPRESSION: Coronary calcium score of 0.



If CAC=0, it is reasonable to withhold statin therapy and reassess
in 5 to 10 years, as long as higher risk conditions are absent
(diabetes mellitus, family history of premature CHD in first degree
relatives (males <55 years; females <65 years), cigarette smoking,
or LDL >=190 mg/dL).

If CAC is 1 to 99, it is reasonable to initiate statin therapy for
patients >=55 years of age.

If CAC is >=100 or >=75th percentile, it is reasonable to initiate
statin therapy at any age.

Cardiology referral should be considered for patients with CAC
scores >=400 or >=75th percentile.

*3331 AHA/ACC/AACVPR/AAPA/ABC/CHIENHUI/CHELSEE/ERLINDA/Balon/GARGI/MOATSHE/MAKKA
Guideline on the Management of Blood Cholesterol: A Report of the
American College of Cardiology/American Heart Association Task Force
on Clinical Practice Guidelines. J Am Coll Cardiol.
0300;73(24):7267-7303.

*** End of Addendum ***
EXAM:
OVER-READ INTERPRETATION  CT CHEST

The following report is an over-read performed by radiologist Dr.
India Moto [REDACTED] on 11/17/2021. This
over-read does not include interpretation of cardiac or coronary
anatomy or pathology. The coronary calcium score/coronary CTA
interpretation by the cardiologist is attached.
FINDINGS: Vascular: Normal heart size. No pericardial effusion. Normal caliber
thoracic aorta with no evidence of atherosclerotic disease.

Mediastinum/Nodes: Esophagus is unremarkable. Calcified left hilar
lymph nodes, likely sequela of prior granulomatous infection. No
pathologically enlarged lymph nodes seen in the chest.

Lungs/Pleura: Central airways are patent. No consolidation, pleural
effusion or pneumothorax. Linear opacities of the lingula with
associated bronchiectasis, likely sequela of prior and affection.
Calcified nodule of the left lower lobe, compatible with granuloma.
Small ground-glass nodule of the right upper lobe measuring 5 mm on
series 2, image 4.

Upper Abdomen: No acute abnormality.

Musculoskeletal: No chest wall mass or suspicious bone lesions
identified.
IMPRESSION: 1. No acute extracardiac findings.
2. Small ground-glass nodule of the right upper lobe measuring 5 mm.
No follow-up recommended. This recommendation follows the consensus
statement: Guidelines for Management of Incidental Pulmonary Nodules
Detected on CT Images: From the [HOSPITAL] 4351; Radiology

## 2022-06-23 ENCOUNTER — Ambulatory Visit: Payer: BC Managed Care – PPO | Admitting: Pulmonary Disease

## 2022-06-29 ENCOUNTER — Telehealth: Payer: Self-pay

## 2022-06-29 MED ORDER — ALBUTEROL SULFATE HFA 108 (90 BASE) MCG/ACT IN AERS: 2.0000 | INHALATION_SPRAY | Freq: Four times a day (QID) | RESPIRATORY_TRACT | 0 refills | Status: DC | PRN

## 2022-06-29 NOTE — Telephone Encounter (Signed)
Spoke with pt who is requesting refill of Albuterol inhaler. Pt is overdue for OV. Pt was scheduled for OV with Dr. Wynona Neat on 07/06/22 at 8:45 am and 1 albuterol inhaler refill order was placed. Nothing further needed at this time.

## 2022-07-06 ENCOUNTER — Ambulatory Visit (INDEPENDENT_AMBULATORY_CARE_PROVIDER_SITE_OTHER): Payer: BC Managed Care – PPO | Admitting: Pulmonary Disease

## 2022-07-06 ENCOUNTER — Encounter: Payer: Self-pay | Admitting: Pulmonary Disease

## 2022-07-06 VITALS — BP 116/82 | HR 71 | Temp 98.2°F | Ht 71.0 in | Wt 162.6 lb

## 2022-07-06 DIAGNOSIS — J45909 Unspecified asthma, uncomplicated: Secondary | ICD-10-CM | POA: Diagnosis not present

## 2022-07-06 MED ORDER — ALBUTEROL SULFATE (2.5 MG/3ML) 0.083% IN NEBU: 2.5000 mg | INHALATION_SOLUTION | Freq: Four times a day (QID) | RESPIRATORY_TRACT | 12 refills | Status: DC | PRN

## 2022-07-06 NOTE — Patient Instructions (Signed)
Graded exercise as tolerated  Prescription for nebulizer to be used as needed up to 4 times a day  Continue Trelegy  Continue albuterol as needed  Schedule for PFT  Follow-up in 3 months

## 2022-07-06 NOTE — Progress Notes (Addendum)
Rachel Fuentes    [id number]    01/23/1961  Primary Care Physician:Hayes, Natale Lay, FNP  Referring Physician: Camie Patience, FNP [address] [place],  [place] [address]  Chief complaint:   Follow-up for shortness of breath  HPI:  Symptoms are a bit better Continues to use Trelegy  Uses albuterol as needed  Multiple allergens to cats and dogs -No change in her immediate environment  Previously was taking allergy shots  Asthma is for most of her adult life  She does have a history of reflux-on famotidine History of gastroparesis, fibromyalgia, osteoarthritis, anxiety and depression  Occupational history-office work  Any kind of activity recently contributes to shortness of breath  Pets: Dogs and cats  Occupation: Office work, grocery  Exposures: New flooring did make symptoms worse  Smoking history: Never smoker, was exposed to a lot of secondhand smoke growing up  Outpatient Encounter Medications as of 07/06/2022  Medication Sig   albuterol (VENTOLIN HFA) 108 (90 Base) MCG/ACT inhaler Inhale 2 puffs into the lungs every 6 (six) hours as needed for wheezing or shortness of breath.   buPROPion (WELLBUTRIN XL) 300 MG 24 hr tablet TK 1 T PO ONCE QAM   diclofenac (VOLTAREN) 50 MG EC tablet TK 1 T PO TID WF OR MILK   FLUoxetine (PROZAC) 40 MG capsule TK 1 C PO QD   gabapentin (NEURONTIN) 400 MG capsule TK 2 CS PO HS   gabapentin (NEURONTIN) 800 MG tablet TK 1 T PO HS   levocetirizine (XYZAL) 5 MG tablet TK 1 T PO ONCE QPM   levothyroxine (SYNTHROID, LEVOTHROID) 50 MCG tablet TK 1 T PO QD   mometasone (NASONEX) 50 MCG/ACT nasal spray    montelukast (SINGULAIR) 10 MG tablet TK 1 T PO ONCE A DAY IN THE EVE   traMADol (ULTRAM) 50 MG tablet    trazodone (DESYREL) 300 MG tablet TK 1 T PO HS PRN   TRELEGY ELLIPTA 200-62.5-25 MCG/ACT AEPB INHALE 1 PUFF INTO THE LUNGS DAILY   valACYclovir (VALTREX) 1000 MG tablet TK 1 T PO TID PRN DURING FLARE    No facility-administered encounter medications on file as of 07/06/2022.    Allergies as of 07/06/2022   (No Known Allergies)    Past Medical History:  Diagnosis Date   Asthma     No past surgical history on file.  Family History  Problem Relation Age of Onset   Breast cancer Neg Hx     Social History   Socioeconomic History   Marital status: Married    Spouse name: Not on file   Number of children: Not on file   Years of education: Not on file   Highest education level: Not on file  Occupational History   Not on file  Tobacco Use   Smoking status: Never   Smokeless tobacco: Never  Substance and Sexual Activity   Alcohol use: Not on file   Drug use: Not on file   Sexual activity: Not on file  Other Topics Concern   Not on file  Social History Narrative   Not on file   Social Determinants of Health   Financial Resource Strain: Not on file  Food Insecurity: Not on file  Transportation Needs: Not on file  Physical Activity: Not on file  Stress: Not on file  Social Connections: Not on file  Intimate Partner Violence: Not on file    Review of Systems  Constitutional:  Positive for fatigue.  Respiratory:  Positive for shortness of breath.     Vitals:   07/06/22 0848  BP: 116/82  Pulse: 71  Temp: 98.2 F (36.8 C)  SpO2: 96%     Physical Exam Constitutional:      Appearance: Normal appearance.  HENT:     Head: Normocephalic and atraumatic.     Mouth/Throat:     Mouth: Mucous membranes are moist.  Eyes:     General:        Right eye: No discharge.        Left eye: No discharge.  Cardiovascular:     Rate and Rhythm: Normal rate and regular rhythm.     Heart sounds: No murmur heard.    No friction rub.  Pulmonary:     Effort: Pulmonary effort is normal. No respiratory distress.     Breath sounds: Normal breath sounds. No stridor. No wheezing or rhonchi.  Musculoskeletal:     Cervical back: No rigidity.  Neurological:     Mental Status:  She is alert.  Psychiatric:        Mood and Affect: Mood normal.    Data Reviewed: No previous x-rays or other studies available  Pulmonary function test today shows no obstruction, no significant bronchodilator response, no restriction with normal diffusing capacity  Assessment:  Shortness of breath  Moderate persistent asthma -Improvement in symptoms with Trelegy -To use albuterol as needed  Allergies -Avoid triggers as able  Plan/Recommendations: Continue Trelegy  Avoid triggers  Graded activities as tolerated  Prescription for nebulizer with albuterol to be used up to 4 times a day as needed  Schedule for pulmonary function test  Follow-up in 3 months  Addendum: Patient went History of asthma, suboptimal control with maximal therapy -Nebulization treatment been prescribed to optimize treatment -To use albuterol up to 4 times a day as needed with nebulizer  Virl Diamond MD [facility] 07/06/2022, 8:53 AM  CC: Camie Patience, FNP

## 2022-07-08 ENCOUNTER — Telehealth: Payer: Self-pay | Admitting: Primary Care

## 2022-07-08 NOTE — Telephone Encounter (Signed)
The note is in [employer] this encounter and sending community msg to [employer]

## 2022-07-08 NOTE — Telephone Encounter (Signed)
[employer] called to request face-to-face notes for order sent on 11/27.  Please advise and call to discuss at [phone] [facility])

## 2022-07-09 ENCOUNTER — Telehealth: Payer: Self-pay | Admitting: Primary Care

## 2022-07-10 NOTE — Telephone Encounter (Signed)
Rachel Fuentes from [employer] states that the office notes from Dr Os visit with patient on 11/27 needs to have justification for using the nebulizer machine.   Please advise sir

## 2022-07-11 ENCOUNTER — Other Ambulatory Visit: Payer: Self-pay | Admitting: Pulmonary Disease

## 2022-07-11 DIAGNOSIS — J4540 Moderate persistent asthma, uncomplicated: Secondary | ICD-10-CM

## 2022-07-14 NOTE — Telephone Encounter (Signed)
Addended notes  Can we send the notes to [employer] to see whether that would be sufficient for the nebulizer

## 2022-07-14 NOTE — Telephone Encounter (Signed)
Called Jazimne from [employer] who will return our call. When she does please let her know OV notes on the pt have been updated by Dr. Wynona Neat.

## 2022-07-15 NOTE — Telephone Encounter (Signed)
Sent a community message to [employer] to let them know that the OV notes was addended.

## 2022-07-31 ENCOUNTER — Other Ambulatory Visit: Payer: Self-pay | Admitting: Pulmonary Disease

## 2022-10-07 ENCOUNTER — Ambulatory Visit (INDEPENDENT_AMBULATORY_CARE_PROVIDER_SITE_OTHER): Payer: BC Managed Care – PPO | Admitting: Pulmonary Disease

## 2022-10-07 ENCOUNTER — Telehealth: Payer: Self-pay

## 2022-10-07 ENCOUNTER — Encounter: Payer: Self-pay | Admitting: Primary Care

## 2022-10-07 ENCOUNTER — Ambulatory Visit: Payer: BC Managed Care – PPO | Admitting: Primary Care

## 2022-10-07 VITALS — BP 120/82 | HR 74 | Temp 98.4°F | Ht 71.0 in | Wt 165.4 lb

## 2022-10-07 DIAGNOSIS — J45909 Unspecified asthma, uncomplicated: Secondary | ICD-10-CM

## 2022-10-07 DIAGNOSIS — J4540 Moderate persistent asthma, uncomplicated: Secondary | ICD-10-CM

## 2022-10-07 MED ORDER — TRELEGY ELLIPTA 100-62.5-25 MCG/ACT IN AEPB: 1.0000 | INHALATION_SPRAY | Freq: Every day | RESPIRATORY_TRACT | 5 refills | Status: DC

## 2022-10-07 NOTE — Progress Notes (Signed)
$'@Patient'e$  ID: Rachel Fuentes, female    DOB: 1960-10-08, 62 y.o.   MRN: [id number]  Chief Complaint  Patient presents with   Follow-up    Review 30 min PFT today.  SOB persistent and fatigue during day.    Referring provider: Saintclair Halsted, FNP  HPI: 62 year old female, never smoked.  Past medical history significant for moderate persistent asthma, LPR.  Patient of Dr. Ander Slade, last seen in office on 07/06/2022.   Previous [facility] pulmonary encounter: 07/07/23- Dr. Ander Slade  Symptoms are a bit better Continues to use Trelegy  Uses albuterol as needed  Multiple allergens to cats and dogs -No change in her immediate environment  Previously was taking allergy shots  Asthma is for most of her adult life  She does have a history of reflux-on famotidine History of gastroparesis, fibromyalgia, osteoarthritis, anxiety and depression  Occupational history-office work  Any kind of activity recently contributes to shortness of breath  Pets: Dogs and cats  Occupation: Office work, grocery  Exposures: New flooring did make symptoms worse  Smoking history: Never smoker, was exposed to a lot of secondhand smoke growing up  10/07/2022- Interim hx  Patient presents today for follow-up asthma  Hx asthma for most of her life. She has multiple allergens to cats/dogs, dust mites. IgE 123/ Eos absolute 100 in Feb 2023 Maintained on Trelegy 263mg one puff daily She is doing well today without acute asthma symptoms She has chronic dyspnea, reports difficulty taking a deep breath.  Echocardiogram in April 2023 showed normal EF with mild diastolic dysfunction  No overt chest tightness or wheezing No recent asthma exacerbations requiring oral prednisone Spirometry today was normal without obstruction. She did not have full pulmonary function testing as PFTs were only scheduled for 30 mins. Patient did not want to reschedule visit.    Pulmonary function testing: 10/07/2022 FVC 4.16  (100%), FEV1 3.31 (103%), ratio 80, DLCOcor 24.69 (98%)   No Known Allergies  Immunization History  Administered Date(s) Administered   Influenza Whole 05/16/2020   Influenza, High Dose Seasonal PF 10/10/2019, 10/09/2020   Influenza, Quadrivalent, Recombinant, Inj, Pf 05/06/2019   PFIZER(Purple Top)SARS-COV-2 Vaccination 10/09/2020   Pneumococcal Polysaccharide-23 10/10/2019, 10/09/2020    Past Medical History:  Diagnosis Date   Asthma     Tobacco History: Social History   Tobacco Use  Smoking Status Never  Smokeless Tobacco Never   Counseling given: Not Answered   Outpatient Medications Prior to Visit  Medication Sig Dispense Refill   albuterol (PROVENTIL) (2.5 MG/3ML) 0.083% nebulizer solution Take 3 mLs (2.5 mg total) by nebulization every 6 (six) hours as needed for wheezing or shortness of breath. 75 mL 12   albuterol (VENTOLIN HFA) 108 (90 Base) MCG/ACT inhaler INHALE 2 PUFFS INTO THE LUNGS EVERY 6 HOURS AS NEEDED FOR WHEEZING OR SHORTNESS OF BREATH 6.7 g 3   buPROPion (WELLBUTRIN XL) 300 MG 24 hr tablet TK 1 T PO ONCE QAM  3   diclofenac (VOLTAREN) 50 MG EC tablet TK 1 T PO TID WF OR MILK  3   FLUoxetine (PROZAC) 40 MG capsule TK 1 C PO QD  3   gabapentin (NEURONTIN) 400 MG capsule TK 2 CS PO HS  2   gabapentin (NEURONTIN) 800 MG tablet TK 1 T PO HS  3   levocetirizine (XYZAL) 5 MG tablet TK 1 T PO ONCE QPM  0   levothyroxine (SYNTHROID, LEVOTHROID) 50 MCG tablet TK 1 T PO QD  2   methocarbamol (  ROBAXIN) 500 MG tablet Take 500 mg by mouth every 8 (eight) hours as needed for muscle spasms (usually takes twice a week).     mometasone (NASONEX) 50 MCG/ACT nasal spray      montelukast (SINGULAIR) 10 MG tablet TK 1 T PO ONCE A DAY IN THE EVE  0   trazodone (DESYREL) 300 MG tablet TK 1 T PO HS PRN  11   valACYclovir (VALTREX) 1000 MG tablet TK 1 T PO TID PRN DURING FLARE  8   Fluticasone-Umeclidin-Vilant (TRELEGY ELLIPTA) 200-62.5-25 MCG/ACT AEPB INHALE 1 PUFF INTO THE  LUNGS DAILY 60 each 11   traMADol (ULTRAM) 50 MG tablet  (Patient not taking: Reported on 10/07/2022)     No facility-administered medications prior to visit.    Review of Systems  Review of Systems  Constitutional: Negative.   HENT: Negative.    Respiratory: Negative.  Negative for cough, chest tightness, shortness of breath and wheezing.   Cardiovascular: Negative.  Negative for leg swelling.   Physical Exam  BP 120/82 (BP Location: Right Arm, Patient Position: Sitting, Cuff Size: Normal)   Pulse 74   Temp 98.4 F (36.9 C) (Oral)   Ht '5\' 11"'$  (1.803 m)   Wt 165 lb 6.4 oz (75 kg)   SpO2 97%   BMI 23.07 kg/m  Physical Exam Constitutional:      Appearance: Normal appearance.  HENT:     Head: Normocephalic and atraumatic.     Mouth/Throat:     Mouth: Mucous membranes are moist.     Pharynx: Oropharynx is clear.  Cardiovascular:     Rate and Rhythm: Normal rate and regular rhythm.  Pulmonary:     Effort: Pulmonary effort is normal.     Breath sounds: Normal breath sounds. No wheezing, rhonchi or rales.  Musculoskeletal:        General: Normal range of motion.  Skin:    General: Skin is warm and dry.  Neurological:     General: No focal deficit present.     Mental Status: She is alert and oriented to person, place, and time. Mental status is at baseline.  Psychiatric:        Mood and Affect: Mood normal.        Behavior: Behavior normal.        Thought Content: Thought content normal.        Judgment: Judgment normal.      Lab Results:  CBC    Component Value Date/Time   WBC 7.1 09/25/2021 1110   RBC 3.98 09/25/2021 1110   HGB 12.5 09/25/2021 1110   HCT 36.3 09/25/2021 1110   PLT 248.0 09/25/2021 1110   MCV 91.2 09/25/2021 1110   MCH 29.3 05/22/2017 2024   MCHC 34.3 09/25/2021 1110   RDW 12.8 09/25/2021 1110   LYMPHSABS 2.7 09/25/2021 1110   MONOABS 0.6 09/25/2021 1110   EOSABS 0.1 09/25/2021 1110   BASOSABS 0.1 09/25/2021 1110    BMET    Component  Value Date/Time   NA 138 05/22/2017 2024   K 3.4 (L) 05/22/2017 2024   CL 103 05/22/2017 2024   CO2 29 05/22/2017 2024   GLUCOSE 111 (H) 05/22/2017 2024   BUN 15 05/22/2017 2024   CREATININE 0.77 05/22/2017 2024   CALCIUM 9.7 05/22/2017 2024   GFRNONAA >60 05/22/2017 2024   GFRAA >60 05/22/2017 2024    BNP No results found for: "BNP"  ProBNP    Component Value Date/Time   PROBNP 25.0 09/25/2021  1110    Imaging: No results found.   Assessment & Plan:   Moderate persistent asthma without complication - Stable interval; No acute respiratory symptoms or recent exacerbations requiring oral prednisone. She has multiple allergens to cats/dogs, dust mites. IgE 123/ Eos absolute 100 in Feb 2023. Spirometry testing today was normal without obstructive defect/ FEV1 3.31 (103%), ratio 80. Recommend de-escalating therapy to Trelegy 198mg one puff daily. Continue Albuterol hfa/neb every 4-6 hours for breakthrough sob/wheezing. FU in 6 months or sooner.      EMartyn Ehrich NP 10/07/2022

## 2022-10-07 NOTE — Progress Notes (Signed)
Spiro/DLCO performed today.  

## 2022-10-07 NOTE — Assessment & Plan Note (Signed)
-   Stable interval; No acute respiratory symptoms or recent exacerbations requiring oral prednisone. She has multiple allergens to cats/dogs, dust mites. IgE 123/ Eos absolute 100 in Feb 2023. Spirometry testing today was normal without obstructive defect/ FEV1 3.31 (103%), ratio 80. Recommend de-escalating therapy to Trelegy 121mg one puff daily. Continue Albuterol hfa/neb every 4-6 hours for breakthrough sob/wheezing. FU in 6 months or sooner.

## 2022-10-07 NOTE — Patient Instructions (Signed)
Spiro/DLCO performed today.  

## 2022-10-07 NOTE — Telephone Encounter (Signed)
Per Dr. Ander Slade,  We will need to reschedule patients PFT and appointment today with Derl Barrow, NP.  Patient was scheduled for a 30 minute PFT but Dr. Ander Slade wants a full PFT.  Patient was scheduled at 11:00am for the PFT and 11:30am to see Derl Barrow, NP. Please call patient and have her reschedule the PFT as a full PFT and then a follow up with Beth.  Left detailed message on patient VM to call the clinic at her earliest convenience to reschedule her appointments today.  Will notify front desk of information and make sure to schedule full PFT and then OV.  Note:  Patient arrived for OV.  She did receive the message but wants to be seen as she took off work today.  Per [place],  will do 30 min PFT and then she will see patient.  Front desk informed and patient is being checked in.

## 2022-10-07 NOTE — Patient Instructions (Addendum)
Pulmonary function testing was normal Recommend decreasing steroid in your inhaler If wheezing or chest tightness worsens let us know and we can always go back up on Trelegy dose Follow up with cardiology about diastolic dysfunction on echocardiogram, not urgent but could be contributing to dyspnea symptoms Aim to get 30 mins cardiovascular exercise 3-5 times a week  Follow-up 6 months with Dr. Ander Slade or sooner if needed

## 2022-10-08 ENCOUNTER — Ambulatory Visit (HOSPITAL_BASED_OUTPATIENT_CLINIC_OR_DEPARTMENT_OTHER): Payer: BC Managed Care – PPO | Admitting: Cardiology

## 2022-10-08 ENCOUNTER — Encounter (HOSPITAL_BASED_OUTPATIENT_CLINIC_OR_DEPARTMENT_OTHER): Payer: Self-pay | Admitting: Cardiology

## 2022-10-08 VITALS — BP 118/82 | HR 68 | Ht 71.0 in | Wt 164.6 lb

## 2022-10-08 DIAGNOSIS — Z7189 Other specified counseling: Secondary | ICD-10-CM

## 2022-10-08 DIAGNOSIS — R0609 Other forms of dyspnea: Secondary | ICD-10-CM | POA: Diagnosis not present

## 2022-10-08 DIAGNOSIS — Z713 Dietary counseling and surveillance: Secondary | ICD-10-CM

## 2022-10-08 DIAGNOSIS — Z7182 Exercise counseling: Secondary | ICD-10-CM | POA: Diagnosis not present

## 2022-10-08 NOTE — Progress Notes (Signed)
Cardiology Office Note:    Date:  10/08/2022   ID:  Rachel Fuentes, DOB Oct 04, 1960, MRN [id number]  PCP:  Saintclair Halsted, FNP  Cardiologist:  Buford Dresser, MD  Referring MD: Saintclair Halsted, FNP   CC:  Follow-up  History of Present Illness:    Rachel Fuentes is a 62 y.o. female with a hx of moderate persistent asthma, allergies, family history of heart disease who is seen for follow-up. She was initially seen 11/10/2021 as a new consult at the request of Saintclair Halsted, FNP for the evaluation and management of shortness of breath. Follows with pulmonology and allergy/immunology as well.  Cardiovascular risk factors: Prior clinical ASCVD: None. Comorbid conditions: Hyperlipidemia - She has never been on medication for this. Most recent lab work in 02/2021. Chronic inflammatory conditions: requires steroids for her asthma several times/year Tobacco use history: Never. Family history: Her father had 3 heart attacks, died due to the 3rd heart attack. He was also a known chain smoker. Exercise level: Her formal exercise is significantly limited by her asthma and shortness of breath. Occasionally she suffers from costochondritis. 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 Current diet: Eating "junk" lately including fast foods. Fruit and vegetable smoothies every day. Recently her husband started a vegan diet. She is cooking for both of them and will eat meats including chicken.  At her initial appointment she had been concerned about her cardiovascular health following an asthma flare-up lasting for a couple weeks. After shared decision making we pursued cardiac workup. Coronary CT 11/17/2021 revealed a coronary calcium score of 0. Echocardiogram 11/24/2021 revealed LVEF 60-65%, asymmetric LVH of the basal-septal segment, and grade 1 diastolic dysfunction. Right ventricular systolic pressure was Q000111Q mmHg. 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  She was seen by pulmonology 10/07/2022 where she reported that any kind of activity recently contributed to her shortness of breath. Spirometry testing was normal without obstructive defect. Trelegy was decreased to 100 mcg, one puff daily. It was recommended to follow up with cardiology regarding diastolic dysfunction noted on her echocardiogram 11/2021 as it could be contributing to her dyspnea.  Today, she is accompanied by her husband. She complains of ongoing fatigue and dyspnea. She wakes up feeling short of breath, if she tries to take a deep breath she is unable. Any exertion will cause her to feel tired and short winded, such as taking a shower. Her albuterol is mostly ineffective.   She notes that her fatigue may keep her in bed until noon.  Strenuous activity that she is able to complete includes walking at a quicker pace than usual. She will still be short of breath but she can complete the walk. Climbing stairs in her home is difficult, although she believes this is also related to deconditioning.  Additionally she admits to an unhealthy diet. Often includes doughnuts or potato chips.  She endorses severe snoring as well.  She denies any palpitations, chest pain, or peripheral edema. No lightheadedness, headaches, syncope, orthopnea, or PND.    Past Medical History:  Diagnosis Date   Asthma     History reviewed. No pertinent surgical history.  Current Medications: Current Outpatient Medications on File Prior to Visit  Medication Sig   albuterol (PROVENTIL) (2.5 MG/3ML) 0.083% nebulizer solution Take 3 mLs (2.5 mg total) by nebulization every 6 (six) hours as needed for wheezing or shortness of breath.   albuterol (  VENTOLIN HFA) 108 (90 Base) MCG/ACT inhaler INHALE 2 PUFFS INTO THE LUNGS EVERY 6 HOURS AS NEEDED FOR WHEEZING OR SHORTNESS OF BREATH   buPROPion (WELLBUTRIN XL) 300 MG 24 hr tablet TK 1 T PO ONCE QAM    diclofenac (VOLTAREN) 50 MG EC tablet TK 1 T PO TID WF OR MILK   FLUoxetine (PROZAC) 40 MG capsule TK 1 C PO QD   Fluticasone-Umeclidin-Vilant (TRELEGY ELLIPTA) 100-62.5-25 MCG/ACT AEPB Inhale 1 puff into the lungs daily.   gabapentin (NEURONTIN) 400 MG capsule TK 2 CS PO HS   gabapentin (NEURONTIN) 800 MG tablet TK 1 T PO HS   levocetirizine (XYZAL) 5 MG tablet TK 1 T PO ONCE QPM   levothyroxine (SYNTHROID, LEVOTHROID) 50 MCG tablet TK 1 T PO QD   methocarbamol (ROBAXIN) 500 MG tablet Take 500 mg by mouth every 8 (eight) hours as needed for muscle spasms (usually takes twice a week).   mometasone (NASONEX) 50 MCG/ACT nasal spray    montelukast (SINGULAIR) 10 MG tablet TK 1 T PO ONCE A DAY IN THE EVE   traMADol (ULTRAM) 50 MG tablet    trazodone (DESYREL) 300 MG tablet TK 1 T PO HS PRN   valACYclovir (VALTREX) 1000 MG tablet TK 1 T PO TID PRN DURING FLARE   No current facility-administered medications on file prior to visit.     Allergies:   Patient has no known allergies.   Social History   Tobacco Use   Smoking status: Never   Smokeless tobacco: Never    Family History: family history is negative for Breast cancer.  ROS:   Please see the history of present illness. (+) Fatigue (+) Shortness of breath (+) Snoring All other systems are reviewed and negative.   EKGs/Labs/Other Studies Reviewed:    The following studies were reviewed today:  Echo  11/24/2021:  1. Left ventricular ejection fraction, by estimation, is 60 to 65%. Left  ventricular ejection fraction by 3D volume is 63 %. The left ventricle has  normal function. The left ventricle has no regional wall motion  abnormalities. There is severe asymmetric   left ventricular hypertrophy of the basal-septal segment. Left  ventricular diastolic parameters are consistent with Grade I diastolic  dysfunction (impaired relaxation).   2. Right ventricular systolic function is normal. The right ventricular  size is normal.  There is normal pulmonary artery systolic pressure. The  estimated right ventricular systolic pressure is Q000111Q mmHg.   3. The mitral valve is grossly normal. Trivial mitral valve  regurgitation. No evidence of mitral stenosis.   4. The aortic valve is tricuspid. Aortic valve regurgitation is trivial.  No aortic stenosis is present.   5. Aortic dilatation noted. There is borderline dilatation of the  ascending aorta, measuring 37 mm.   6. The inferior vena cava is dilated in size with >50% respiratory  variability, suggesting right atrial pressure of 8 mmHg.   Comparison(s): No prior Echocardiogram.   CT Cardiac Scoring  11/17/2021: Ascending Aorta: Normal caliber. Ascending aorta measures approximately 68m at the mid ascending aorta measured in an axial plane.  IMPRESSION: Coronary calcium score of 0.   EKG:  EKG is personally reviewed.   10/08/2022:  NSR at 68 bpm, iRBBB 11/10/2021 NSR at 74 bpm, iRBBB, LVH  Recent Labs: No results found for requested labs within last 365 days.   Recent Lipid Panel No results found for: "CHOL", "TRIG", "HDL", "CHOLHDL", "VLDL", "LDLCALC", "LDLDIRECT"  Physical Exam:    VS:  BP 118/82 (BP Location: Left Arm, Patient Position: Sitting, Cuff Size: Normal)   Pulse 68   Ht '5\' 11"'$  (1.803 m)   Wt 164 lb 9.6 oz (74.7 kg)   BMI 22.96 kg/m     Wt Readings from Last 3 Encounters:  10/08/22 164 lb 9.6 oz (74.7 kg)  10/07/22 165 lb 6.4 oz (75 kg)  07/06/22 162 lb 9.6 oz (73.8 kg)    GEN: Well nourished, well developed in no acute distress HEENT: Normal, moist mucous membranes NECK: No JVD CARDIAC: regular rhythm, normal S1 and S2, no rubs or gallops. No murmur. VASCULAR: Radial and DP pulses 2+ bilaterally. No carotid bruits RESPIRATORY:  Clear to auscultation without rales, wheezing or rhonchi  ABDOMEN: Soft, non-tender, non-distended MUSCULOSKELETAL:  Ambulates independently SKIN: Warm and dry, no edema NEUROLOGIC:  Alert and oriented x 3.  No focal neuro deficits noted. PSYCHIATRIC:  Normal affect    ASSESSMENT:    1. DOE (dyspnea on exertion)   2. Cardiac risk counseling   3. Nutritional counseling   4. Counseling on health promotion and disease prevention   5. Exercise counseling     PLAN:    Shortness of breath/Dyspnea on exertion -she has grade 1 diastolic dysfunction. She was told she has heart failure. This is not the case -recent PFTs unremarkable. No improvement with albuterol inhaler -echo overall unremarkable -discussed options. After shared decision making, will pursue CPX  Borderline hypercholesterolemia Family history of heart disease Lipids per [facility] 03/04/21: Tchol 188, HDL 60, LDL 109. TG 109 -calcium score of 0, no indication for statin  Cardiac risk counseling and prevention recommendations: discussed extensively today -recommend heart healthy/Mediterranean diet, with whole grains, fruits, vegetable, fish, lean meats, nuts, and olive oil. Limit salt. -recommend moderate walking, 3-5 times/week for 30-50 minutes each session. Aim for at least 150 minutes.week. Goal should be pace of 3 miles/hours, or walking 1.5 miles in 30 minutes -recommend avoidance of tobacco products. Avoid excess alcohol. -ASCVD risk score: The ASCVD Risk score (Arnett DK, et al., 2019) failed to calculate for the following reasons:   Cannot find a previous HDL lab   Cannot find a previous total cholesterol lab    Plan for follow up: 6 months, or sooner as needed.  Buford Dresser, MD, PhD, [facility]    Medication Adjustments/Labs and Tests Ordered: Current medicines are reviewed at length with the patient today.  Concerns regarding medicines are outlined above.   Orders Placed This Encounter  Procedures   Cardiopulmonary exercise test   Cardiac Stress Test: Informed Consent Details: Physician/Practitioner Attestation; Transcribe to consent form and obtain patient signature   EKG 12-Lead    No orders of the defined types were placed in this encounter.  Patient Instructions  Medication Instructions:  Your physician recommends that you continue on your current medications as directed. Please refer to the Current Medication list given to you today.  *If you need a refill on your cardiac medications before your next appointment, please call your pharmacy*  Lab Work: NONE  Testing/Procedures: Your physician has recommended that you have a cardiopulmonary stress test (CPX). CPX testing is a non-invasive measurement of heart and lung function. It replaces a traditional treadmill stress test. This type of test provides a tremendous amount of information that relates not only to your present condition but also for future outcomes. This test combines measurements of you ventilation, respiratory gas exchange in the lungs, electrocardiogram (EKG), blood pressure and physical response  before, during, and following an exercise protocol.  Follow-Up: At [facility], you and your health needs are our priority.  As part of our continuing mission to provide you with exceptional heart care, we have created designated Provider Care Teams.  These Care Teams include your primary Cardiologist (physician) and Advanced Practice Providers (APPs -  Physician Assistants and Nurse Practitioners) who all work together to provide you with the care you need, when you need it.  We recommend signing up for the patient portal called "MyChart".  Sign up information is provided on this After Visit Summary.  MyChart is used to connect with patients for Virtual Visits (Telemedicine).  Patients are able to view lab/test results, encounter notes, upcoming appointments, etc.  Non-urgent messages can be sent to your provider as well.   To learn more about what you can do with MyChart, go to [email or web address].    Your next appointment:   6 month(s)  The format for your next appointment:   In  Person  Provider:   Buford Dresser, MD    Link to the [place] for eating recommendations/Mediterranean diet [email or web address]   I,Mathew Stumpf,acting as a scribe for Buford Dresser, MD.,have documented all relevant documentation on the behalf of Buford Dresser, MD,as directed by  Buford Dresser, MD while in the presence of Buford Dresser, MD.  I, Buford Dresser, MD, have reviewed all documentation for this visit. The documentation on 10/08/22 for the exam, diagnosis, procedures, and orders are all accurate and complete.   Signed, Buford Dresser, MD PhD 10/08/2022 12:53 PM    [place]

## 2022-10-08 NOTE — Patient Instructions (Addendum)
Medication Instructions:  Your physician recommends that you continue on your current medications as directed. Please refer to the Current Medication list given to you today.  *If you need a refill on your cardiac medications before your next appointment, please call your pharmacy*  Lab Work: NONE  Testing/Procedures: Your physician has recommended that you have a cardiopulmonary stress test (CPX). CPX testing is a non-invasive measurement of heart and lung function. It replaces a traditional treadmill stress test. This type of test provides a tremendous amount of information that relates not only to your present condition but also for future outcomes. This test combines measurements of you ventilation, respiratory gas exchange in the lungs, electrocardiogram (EKG), blood pressure and physical response before, during, and following an exercise protocol.  Follow-Up: At [facility], you and your health needs are our priority.  As part of our continuing mission to provide you with exceptional heart care, we have created designated Provider Care Teams.  These Care Teams include your primary Cardiologist (physician) and Advanced Practice Providers (APPs -  Physician Assistants and Nurse Practitioners) who all work together to provide you with the care you need, when you need it.  We recommend signing up for the patient portal called "MyChart".  Sign up information is provided on this After Visit Summary.  MyChart is used to connect with patients for Virtual Visits (Telemedicine).  Patients are able to view lab/test results, encounter notes, upcoming appointments, etc.  Non-urgent messages can be sent to your provider as well.   To learn more about what you can do with MyChart, go to [email or web address].    Your next appointment:   6 month(s)  The format for your next appointment:   In Person  Provider:   Buford Dresser, MD    Link to the [place] for eating  recommendations/Mediterranean diet [email or web address]

## 2022-10-11 LAB — PULMONARY FUNCTION TEST
DL/VA % pred: 93 %
DL/VA: 3.73 ml/min/mmHg/L
DLCO cor % pred: 98 %
DLCO cor: 24.69 ml/min/mmHg
DLCO unc % pred: 95 %
DLCO unc: 23.98 ml/min/mmHg
FEF 25-75 Pre: 3.08 L/sec
FEF2575-%Pred-Pre: 114 %
FEV1-%Pred-Pre: 103 %
FEV1-Pre: 3.31 L
FEV1FVC-%Pred-Pre: 102 %
FEV6-%Pred-Pre: 103 %
FEV6-Pre: 4.15 L
FEV6FVC-%Pred-Pre: 103 %
FVC-%Pred-Pre: 100 %
FVC-Pre: 4.16 L
Pre FEV1/FVC ratio: 80 %
Pre FEV6/FVC Ratio: 100 %

## 2022-11-05 ENCOUNTER — Ambulatory Visit (HOSPITAL_COMMUNITY): Payer: BC Managed Care – PPO | Attending: Internal Medicine

## 2022-11-05 DIAGNOSIS — Z79899 Other long term (current) drug therapy: Secondary | ICD-10-CM | POA: Insufficient documentation

## 2022-11-05 DIAGNOSIS — J45909 Unspecified asthma, uncomplicated: Secondary | ICD-10-CM | POA: Insufficient documentation

## 2022-11-05 DIAGNOSIS — R0600 Dyspnea, unspecified: Secondary | ICD-10-CM | POA: Insufficient documentation

## 2022-11-05 DIAGNOSIS — Z7951 Long term (current) use of inhaled steroids: Secondary | ICD-10-CM | POA: Insufficient documentation

## 2022-11-05 DIAGNOSIS — R0609 Other forms of dyspnea: Secondary | ICD-10-CM

## 2022-12-21 DIAGNOSIS — M2011 Hallux valgus (acquired), right foot: Secondary | ICD-10-CM | POA: Insufficient documentation

## 2023-03-15 ENCOUNTER — Telehealth: Payer: Self-pay | Admitting: Pulmonary Disease

## 2023-03-16 MED ORDER — TRELEGY ELLIPTA 200-62.5-25 MCG/ACT IN AEPB: 1.0000 | INHALATION_SPRAY | Freq: Every day | RESPIRATORY_TRACT | 5 refills | Status: DC

## 2023-03-16 NOTE — Telephone Encounter (Signed)
Yes we can go back up to higher dose, I will send in RX

## 2023-03-18 NOTE — Telephone Encounter (Signed)
Patient aware.  Follow up appointment made.

## 2023-04-19 ENCOUNTER — Other Ambulatory Visit: Payer: Self-pay | Admitting: Family Medicine

## 2023-04-19 DIAGNOSIS — Z1231 Encounter for screening mammogram for malignant neoplasm of breast: Secondary | ICD-10-CM

## 2023-04-19 DIAGNOSIS — E2839 Other primary ovarian failure: Secondary | ICD-10-CM

## 2023-04-21 ENCOUNTER — Encounter (HOSPITAL_BASED_OUTPATIENT_CLINIC_OR_DEPARTMENT_OTHER): Payer: Self-pay | Admitting: Cardiology

## 2023-04-21 ENCOUNTER — Ambulatory Visit (HOSPITAL_BASED_OUTPATIENT_CLINIC_OR_DEPARTMENT_OTHER): Payer: BC Managed Care – PPO | Admitting: Cardiology

## 2023-04-21 VITALS — BP 103/73 | HR 72 | Ht 71.0 in | Wt 166.1 lb

## 2023-04-21 DIAGNOSIS — R0609 Other forms of dyspnea: Secondary | ICD-10-CM

## 2023-04-21 DIAGNOSIS — Z7189 Other specified counseling: Secondary | ICD-10-CM | POA: Diagnosis not present

## 2023-04-21 DIAGNOSIS — Z8249 Family history of ischemic heart disease and other diseases of the circulatory system: Secondary | ICD-10-CM | POA: Diagnosis not present

## 2023-04-21 NOTE — Patient Instructions (Signed)

## 2023-04-21 NOTE — Progress Notes (Signed)
Cardiology Office Note:  .    Date:  04/21/2023  ID:  Rachel Fuentes, DOB March 28, 1961, MRN [id number] PCP: No primary care provider on file.  [facility] Providers Cardiologist:  Jodelle Red, MD     History of Present Illness: .    Rachel Fuentes is a 62 y.o. female with a hx of moderate persistent asthma, allergies, family history of heart disease who is seen for follow-up. She was initially seen 11/10/2021 as a new consult at the request of Camie Patience, FNP for the evaluation and management of shortness of breath. Follows with pulmonology and allergy/immunology as well.   Cardiovascular risk factors: Prior clinical ASCVD: None. Comorbid conditions: Hyperlipidemia - She has never been on medication for this. Most recent lab work in 02/2021. Chronic inflammatory conditions: requires steroids for her asthma several times/year Tobacco use history: Never. Family history: Her father had 3 heart attacks, died due to the 3rd heart attack. He was also a known chain smoker. Exercise level: Her formal exercise is significantly limited by her asthma and shortness of breath. Occasionally she suffers from costochondritis. 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 Current diet: Eating "junk" lately including fast foods. Fruit and vegetable smoothies every day. Recently her husband started a vegan diet. She is cooking for both of them and will eat meats including chicken.   At her initial appointment she had been concerned about her cardiovascular health following an asthma flare-up lasting for a couple weeks. After shared decision making we pursued cardiac workup. Coronary CT 11/17/2021 revealed a coronary calcium score of 0. Echocardiogram 11/24/2021 revealed LVEF 60-65%, asymmetric LVH of the basal-septal segment, and grade 1 diastolic dysfunction. Right ventricular systolic pressure was 26.7 mmHg. There was borderline  dilatation of the ascending aorta at 37 mm.   She was seen by pulmonology 10/07/2022 where she reported that any kind of activity recently contributed to her shortness of breath. Spirometry testing was normal without obstructive defect. Trelegy was decreased to 100 mcg, one puff daily. It was recommended to follow up with cardiology regarding diastolic dysfunction noted on her echocardiogram 11/2021 as it could be contributing to her dyspnea.   At her visit 09/2022, she complained of fatigue and dyspnea with any exertion. She was waking up feeling short of breath. Albuterol was ineffective. She noted that her fatigue could keep her in bed until noon. After shared decision making, we pursued CPX which was very reassuring, suggesting no cardiac or lung limitations. Mild deconditioning was noted, recommended to gradually increase her cardiovascular activity.  Today, she is feeling okay. She states that she has lapsed somewhat, but continues to work on her diet and exercise. Most recently, her LDL is 131. She would prefer to keep working on lifestyle changes at this time rather than start cholesterol medication. Her older brother is moving to the area, and he exercises routinely. She plans to go with him to exercise. Her husband is following a vegan diet, so she only has meat rarely. She does not feel that her iron intake is sufficient. Discussed recommendations today.  For a while now her breathing issues have been limiting her formal exercise. Some days she is only able to use an exercise machine for about 6 minutes. May be related to deconditioning. Also has known allergies to cats and dogs, both of which she owns.   She denies any palpitations, chest pain, peripheral edema, lightheadedness, headaches, syncope, orthopnea, or PND.  ROS:  Please see the history of present illness. ROS otherwise negative except as noted.  (+) Dyspnea  Studies Reviewed: .         Cardiopulmonary Exercise Test   11/05/2022: Notes: Patient gave a very good effort. Pulse oximetry remained 100% for the duration of exercise. Exercise testing began in stage 2 of the mod Naughton protocol and proceeded with manual modification (see attached time down data for details).   ECG:  Resting ECG in normal sinus rhythm. The HR response was normal. There were occasional PVCs with intermittent couplets without sustained arrhythmias or ST-T changes. The BP response was appropriate.    PFT:  Pre-exercise spirometry was within normal limits. The MVV was normal.  Post-exercise spirometry was within normal limits.   CPX:  Exercise testing with gas exchange demonstrates a normal peak VO2 of 22.6 ml/kg/min (104% of the age/gender/weight matched sedentary norms). The RER of 1.10 indicates a maximal effort. The VE/VCO2 slope is normal. The oxygen uptake efficiency slope (OUES) is normal. The VO2 at the ventilatory threshold was normal at 76% of the predicted peak VO2. At peak exercise, the ventilation reached 47% of the measured MVV indicating ventilatory reserve remained. The O2pulse (a surrogate for stroke volume) increased with incremental exercise reaching peak at 11 mL/beat (109% predicted).   Conclusion: Exercise testing with gas exchange demonstrates normal functional capacity when compared to matched sedentary norms. There is no cardiopulmonary limitation or evidence of exercise induced bronchospasm. Patient presents with mild deconditioning that can be improved with a routine exercise program.     Physical Exam:    VS:  There were no vitals taken for this visit.   Wt Readings from Last 3 Encounters:  10/08/22 164 lb 9.6 oz (74.7 kg)  10/07/22 165 lb 6.4 oz (75 kg)  07/06/22 162 lb 9.6 oz (73.8 kg)    GEN: Well nourished, well developed in no acute distress HEENT: Normal, moist mucous membranes NECK: No JVD CARDIAC: regular rhythm, normal S1 and S2, no rubs or gallops. No murmur. VASCULAR: Radial and DP pulses 2+  bilaterally. No carotid bruits RESPIRATORY:  Clear to auscultation without rales, wheezing or rhonchi  ABDOMEN: Soft, non-tender, non-distended MUSCULOSKELETAL:  Ambulates independently SKIN: Warm and dry, no edema NEUROLOGIC:  Alert and oriented x 3. No focal neuro deficits noted. PSYCHIATRIC:  Normal affect   ASSESSMENT AND PLAN: .    Shortness of breath/Dyspnea on exertion -she has grade 1 diastolic dysfunction. She was told she has heart failure. This is not the case -recent PFTs unremarkable. No improvement with albuterol inhaler -echo overall unremarkable -CPX with good effort, O2 sat remained 100% throughout. Normal HR response. Occasional PVCs. Normal peak VO2 of 22.6. Overall normal functional capacity, no cardiopulmonary limitation. Mild deconditioning   Borderline hypercholesterolemia Family history of heart disease Lipids per [facility] 04/14/23: Tchol 218, HDL 73, LDL 131. TG 78. Ldl up slightly from prior. Re-discussed lifestyle recommendations -calcium score of 0, no indication for statin   Cardiac risk counseling and prevention recommendations: discussed extensively today -recommend heart healthy/Mediterranean diet, with whole grains, fruits, vegetable, fish, lean meats, nuts, and olive oil. Limit salt. -recommend moderate walking, 3-5 times/week for 30-50 minutes each session. Aim for at least 150 minutes.week. Goal should be pace of 3 miles/hours, or walking 1.5 miles in 30 minutes -recommend avoidance of tobacco products. Avoid excess alcohol.  Dispo: Follow-up in 1 year, or sooner as needed.  I,Mathew Stumpf,acting as a Neurosurgeon for [employer], MD.,have documented all relevant  documentation on the behalf of Jodelle Red, MD,as directed by  Jodelle Red, MD while in the presence of Jodelle Red, MD.  I, Jodelle Red, MD, have reviewed all documentation for this visit. The documentation on 06/07/23 for the exam, diagnosis,  procedures, and orders are all accurate and complete.   Signed, Jodelle Red, MD

## 2023-04-29 ENCOUNTER — Ambulatory Visit: Payer: BC Managed Care – PPO | Admitting: Primary Care

## 2023-05-07 ENCOUNTER — Ambulatory Visit
Admission: RE | Admit: 2023-05-07 | Discharge: 2023-05-07 | Disposition: A | Discharge status: Home / Self Care | Payer: BC Managed Care – PPO | Source: Ambulatory Visit | Attending: Family Medicine | Admitting: Family Medicine

## 2023-05-07 DIAGNOSIS — Z1231 Encounter for screening mammogram for malignant neoplasm of breast: Secondary | ICD-10-CM

## 2023-06-07 ENCOUNTER — Encounter (HOSPITAL_BASED_OUTPATIENT_CLINIC_OR_DEPARTMENT_OTHER): Payer: Self-pay | Admitting: Cardiology

## 2023-06-14 ENCOUNTER — Ambulatory Visit: Payer: BC Managed Care – PPO | Admitting: Primary Care

## 2023-06-17 ENCOUNTER — Encounter: Payer: Self-pay | Admitting: Primary Care

## 2023-06-28 ENCOUNTER — Ambulatory Visit: Payer: Self-pay

## 2023-06-28 ENCOUNTER — Inpatient Hospital Stay
Admission: RE | Admit: 2023-06-28 | Discharge: 2023-06-28 | Disposition: A | Discharge status: Home / Self Care | Payer: BC Managed Care – PPO | Source: Ambulatory Visit | Attending: Family Medicine | Admitting: Family Medicine

## 2023-06-28 DIAGNOSIS — E2839 Other primary ovarian failure: Secondary | ICD-10-CM

## 2023-09-21 ENCOUNTER — Observation Stay (HOSPITAL_COMMUNITY)
Admission: EM | Admit: 2023-09-21 | Discharge: 2023-09-25 | Disposition: A | Discharge status: Home / Self Care | Payer: 59 | Attending: Neurosurgery | Admitting: Neurosurgery

## 2023-09-21 ENCOUNTER — Other Ambulatory Visit: Payer: Self-pay

## 2023-09-21 ENCOUNTER — Emergency Department (HOSPITAL_COMMUNITY): Payer: 59

## 2023-09-21 DIAGNOSIS — K6289 Other specified diseases of anus and rectum: Secondary | ICD-10-CM | POA: Diagnosis not present

## 2023-09-21 DIAGNOSIS — K3184 Gastroparesis: Secondary | ICD-10-CM | POA: Diagnosis present

## 2023-09-21 DIAGNOSIS — M797 Fibromyalgia: Secondary | ICD-10-CM | POA: Diagnosis not present

## 2023-09-21 DIAGNOSIS — K5900 Constipation, unspecified: Secondary | ICD-10-CM | POA: Diagnosis not present

## 2023-09-21 DIAGNOSIS — J4540 Moderate persistent asthma, uncomplicated: Secondary | ICD-10-CM | POA: Diagnosis present

## 2023-09-21 DIAGNOSIS — Z79899 Other long term (current) drug therapy: Secondary | ICD-10-CM | POA: Diagnosis not present

## 2023-09-21 DIAGNOSIS — E039 Hypothyroidism, unspecified: Secondary | ICD-10-CM | POA: Insufficient documentation

## 2023-09-21 DIAGNOSIS — K5641 Fecal impaction: Principal | ICD-10-CM | POA: Diagnosis present

## 2023-09-21 DIAGNOSIS — K640 First degree hemorrhoids: Secondary | ICD-10-CM | POA: Insufficient documentation

## 2023-09-21 DIAGNOSIS — K626 Ulcer of anus and rectum: Secondary | ICD-10-CM | POA: Diagnosis not present

## 2023-09-21 DIAGNOSIS — R110 Nausea: Secondary | ICD-10-CM | POA: Diagnosis present

## 2023-09-21 DIAGNOSIS — K5730 Diverticulosis of large intestine without perforation or abscess without bleeding: Secondary | ICD-10-CM | POA: Diagnosis not present

## 2023-09-21 HISTORY — DX: Fibromyalgia: M79.7

## 2023-09-21 MED ORDER — FENTANYL CITRATE PF 50 MCG/ML IJ SOSY: 100.0000 ug | PREFILLED_SYRINGE | Freq: Once | INTRAMUSCULAR | Status: AC

## 2023-09-21 MED ORDER — FENTANYL CITRATE PF 50 MCG/ML IJ SOSY
PREFILLED_SYRINGE | INTRAMUSCULAR | Status: AC
  Administered 2023-09-21: 100 ug via INTRAVENOUS
  Filled 2023-09-21: qty 2

## 2023-09-21 MED ORDER — LORAZEPAM 1 MG PO TABS
1.0000 mg | ORAL_TABLET | Freq: Once | ORAL | Status: AC
  Administered 2023-09-21: 1 mg via ORAL
  Filled 2023-09-21: qty 1

## 2023-09-21 MED ORDER — MORPHINE SULFATE (PF) 4 MG/ML IV SOLN
4.0000 mg | Freq: Once | INTRAVENOUS | Status: DC
  Filled 2023-09-21: qty 1

## 2023-09-21 MED ORDER — MORPHINE SULFATE (PF) 4 MG/ML IV SOLN: 4.0000 mg | Freq: Once | INTRAVENOUS | Status: DC

## 2023-09-21 MED ORDER — MORPHINE SULFATE (PF) 4 MG/ML IV SOLN
4.0000 mg | Freq: Once | INTRAVENOUS | Status: AC
  Administered 2023-09-21: 4 mg via INTRAVENOUS

## 2023-09-21 MED ORDER — ONDANSETRON HCL 4 MG/2ML IJ SOLN: 4.0000 mg | Freq: Four times a day (QID) | INTRAMUSCULAR | Status: DC | PRN

## 2023-09-21 MED ORDER — LIDOCAINE 4 % EX CREA
TOPICAL_CREAM | Freq: Once | CUTANEOUS | Status: DC
  Filled 2023-09-21: qty 5

## 2023-09-21 MED ORDER — LIDOCAINE HCL URETHRAL/MUCOSAL 2 % EX GEL
1.0000 | Freq: Once | CUTANEOUS | Status: AC
  Administered 2023-09-21: 1 via TOPICAL
  Filled 2023-09-21: qty 11

## 2023-09-21 MED ORDER — SODIUM CHLORIDE 0.9 % IV SOLN: INTRAVENOUS | Status: AC

## 2023-09-21 MED ORDER — MIDAZOLAM HCL 2 MG/2ML IJ SOLN
1.0000 mg | Freq: Once | INTRAMUSCULAR | Status: AC
  Administered 2023-09-21: 1 mg via INTRAVENOUS
  Filled 2023-09-21: qty 2

## 2023-09-21 MED ORDER — DOCUSATE SODIUM 100 MG PO CAPS
100.0000 mg | ORAL_CAPSULE | Freq: Once | ORAL | Status: AC
Start: 2023-09-21 — End: 2023-09-21
  Administered 2023-09-21: 100 mg via ORAL
  Filled 2023-09-21: qty 1

## 2023-09-21 MED ORDER — ONDANSETRON HCL 4 MG PO TABS: 4.0000 mg | ORAL_TABLET | Freq: Four times a day (QID) | ORAL | Status: DC | PRN

## 2023-09-21 MED ORDER — ENOXAPARIN SODIUM 40 MG/0.4ML IJ SOSY
40.0000 mg | PREFILLED_SYRINGE | INTRAMUSCULAR | Status: DC
  Administered 2023-09-22 – 2023-09-25 (×4): 40 mg via SUBCUTANEOUS
  Filled 2023-09-21 (×4): qty 0.4

## 2023-09-21 MED ORDER — PEG 3350-KCL-NA BICARB-NACL 420 G PO SOLR
4000.0000 mL | Freq: Once | ORAL | Status: AC
  Administered 2023-09-22: 4000 mL via ORAL
  Filled 2023-09-21: qty 4000

## 2023-09-21 MED ORDER — POLYETHYLENE GLYCOL 3350 17 G PO PACK
34.0000 g | PACK | Freq: Once | ORAL | Status: AC
  Administered 2023-09-21: 34 g via ORAL
  Filled 2023-09-21: qty 2

## 2023-09-21 MED ORDER — LEVOTHYROXINE SODIUM 50 MCG PO TABS
50.0000 ug | ORAL_TABLET | Freq: Every day | ORAL | Status: DC
Start: 2023-09-22 — End: 2023-09-22
  Filled 2023-09-21: qty 1

## 2023-09-21 NOTE — H&P (Signed)
History and Physical    Patient: Rachel Fuentes ZOX:[id number] DOB: 1961/03/03 DOA: 09/21/2023 DOS: the patient was seen and examined on 09/21/2023 PCP: Camie Patience, FNP  Patient coming from: Home  Chief Complaint:  Chief Complaint  Patient presents with   Fecal Impaction   Nausea   HPI: Rachel Fuentes is a 63 y.o. female with medical history significant of asthma, hypothyroidism, depression with anxiety on chronic tramadol who presented to the ER with severe obstipation.  Patient has had not had bowel movement in a few days and tried to disimpact herself at home.  She removed stool manually and tried to do an enema but no bowel movement.  Came to the ER.  Disimpaction was started in the ER but failed.  Patient had very little stool.  X-ray showed massive amount of stool throughout the colon.  Recommendation is to admit the patient for gradual disimpaction in the hospital  Review of Systems: As mentioned in the history of present illness. All other systems reviewed and are negative. Past Medical History:  Diagnosis Date   Asthma    No past surgical history on file. Social History:  reports that she has never smoked. She has never used smokeless tobacco. No history on file for alcohol use and drug use.  No Known Allergies  Family History  Problem Relation Age of Onset   Breast cancer Neg Hx     Prior to Admission medications   Medication Sig Start Date End Date Taking? Authorizing Provider  albuterol (PROVENTIL) (2.5 MG/3ML) 0.083% nebulizer solution Take 3 mLs (2.5 mg total) by nebulization every 6 (six) hours as needed for wheezing or shortness of breath. 07/06/22 07/06/23  Olalere, Onnie Boer A, MD  albuterol (VENTOLIN HFA) 108 (90 Base) MCG/ACT inhaler INHALE 2 PUFFS INTO THE LUNGS EVERY 6 HOURS AS NEEDED FOR WHEEZING OR SHORTNESS OF BREATH 07/31/22   Olalere, Adewale A, MD  buPROPion (WELLBUTRIN XL) 300 MG 24 hr tablet TK 1 T PO ONCE QAM 02/18/18   [provider]  diclofenac (VOLTAREN) 50 MG EC tablet TK 1 T PO TID WF OR MILK 01/05/18   [provider]  FLUoxetine (PROZAC) 40 MG capsule TK 1 C PO QD 02/18/18   [provider]  Fluticasone-Umeclidin-Vilant (TRELEGY ELLIPTA) 200-62.5-25 MCG/ACT AEPB Inhale 1 puff into the lungs daily. 03/16/23   Glenford Bayley, NP  gabapentin (NEURONTIN) 400 MG capsule TK 2 CS PO HS 03/11/18   [provider]  gabapentin (NEURONTIN) 800 MG tablet TK 1 T PO HS 02/18/18   [provider]  levocetirizine (XYZAL) 5 MG tablet TK 1 T PO ONCE QPM 02/28/18   [provider]  levothyroxine (SYNTHROID, LEVOTHROID) 50 MCG tablet TK 1 T PO QD 02/18/18   [provider]  methocarbamol (ROBAXIN) 500 MG tablet Take 500 mg by mouth every 8 (eight) hours as needed for muscle spasms (usually takes twice a week).    [provider]  mometasone (NASONEX) 50 MCG/ACT nasal spray  03/30/18   [provider]  montelukast (SINGULAIR) 10 MG tablet TK 1 T PO ONCE A DAY IN THE EVE 03/18/18   [provider]  traMADol (ULTRAM) 50 MG tablet  03/30/18   [provider]  trazodone (DESYREL) 300 MG tablet TK 1 T PO HS PRN 02/21/18   [provider]  valACYclovir (VALTREX) 1000 MG tablet TK 1 T PO TID PRN DURING FLARE 01/15/18   [provider]  Physical Exam: Vitals:   09/21/23 2001 09/21/23 2300  BP: 124/76 130/83  Pulse: (!) 112 96  Resp: 16   Temp: 98.5 F (36.9 C)   TempSrc: Oral   SpO2: 99% 96%   Constitutional: NAD, calm, comfortable Eyes: PERRL, lids and conjunctivae normal ENMT: Mucous membranes are moist. Posterior pharynx clear of any exudate or lesions.Normal dentition.  Neck: normal, supple, no masses, no thyromegaly Respiratory: clear to auscultation bilaterally, no wheezing, no crackles. Normal respiratory effort. No accessory muscle use.  Cardiovascular: Sinus tachycardia, no murmurs / rubs / gallops. No extremity  edema. 2+ pedal pulses. No carotid bruits.  Abdomen: no tenderness, no masses palpated. No hepatosplenomegaly. Bowel sounds positive.  Musculoskeletal: Good range of motion, no joint swelling or tenderness, Skin: no rashes, lesions, ulcers. No induration Neurologic: CN 2-12 grossly intact. Sensation intact, DTR normal. Strength 5/5 in all 4.  Psychiatric: Normal judgment and insight. Alert and oriented x 3. Normal mood  Data Reviewed:  Heart rate is 112, abdominal x-ray showed large amount of retained stool in the rectal vault consistent with fecal impaction no obstruction or ileus  Assessment and Plan:  #1 severe obstipation: Patient failed manual disimpaction in the ER.  Will admit the patient.  Start patient on GoLytely.  Will initiate clear liquid diet and supportive care.  If this all fails she may require GI consultation.  #2 moderate persistent asthma without complications: Continue breathing treatment.  #3 fibromyalgia: Patient on chronic pain medications.  There may be complicating obstipation.  Especially the tramadol.  She might require stable GI regimen at discharge.  #4 history of gastroparesis: No nausea vomiting now.    Advance Care Planning:   Code Status: Full Code   Consults: None  Family Communication: No family at bedside  Severity of Illness: The appropriate patient status for this patient is INPATIENT. Inpatient status is judged to be reasonable and necessary in order to provide the required intensity of service to ensure the patient's safety. The patient's presenting symptoms, physical exam findings, and initial radiographic and laboratory data in the context of their chronic comorbidities is felt to place them at high risk for further clinical deterioration. Furthermore, it is not anticipated that the patient will be medically stable for discharge from the hospital within 2 midnights of admission.   * I certify that at the point of admission it is my clinical  judgment that the patient will require inpatient hospital care spanning beyond 2 midnights from the point of admission due to high intensity of service, high risk for further deterioration and high frequency of surveillance required.*  AuthorLonia Blood, MD 09/21/2023 11:35 PM  For on call review [email or web address].

## 2023-09-21 NOTE — ED Notes (Signed)
Patient transported to x-ray. ?

## 2023-09-21 NOTE — ED Provider Notes (Signed)
[place] EMERGENCY DEPARTMENT AT [facility] Provider Note   CSN: [id number] Arrival date & time: 09/21/23  1950     History  Chief Complaint  Patient presents with   Fecal Impaction   Nausea    Hurley Blevins is a 63 y.o. female.  This is a 63 year old female is here today for report of fecal impaction.  Patient had surgery on her foot, have been taking pain medication, has not had a bowel movement for 3 days.  Patient tried to manually disimpact, tried enemas without relief.        Home Medications Prior to Admission medications   Medication Sig Start Date End Date Taking? Authorizing Provider  albuterol (PROVENTIL) (2.5 MG/3ML) 0.083% nebulizer solution Take 3 mLs (2.5 mg total) by nebulization every 6 (six) hours as needed for wheezing or shortness of breath. 07/06/22 07/06/23  Olalere, Onnie Boer A, MD  albuterol (VENTOLIN HFA) 108 (90 Base) MCG/ACT inhaler INHALE 2 PUFFS INTO THE LUNGS EVERY 6 HOURS AS NEEDED FOR WHEEZING OR SHORTNESS OF BREATH 07/31/22   Olalere, Adewale A, MD  buPROPion (WELLBUTRIN XL) 300 MG 24 hr tablet TK 1 T PO ONCE QAM 02/18/18   [provider]  diclofenac (VOLTAREN) 50 MG EC tablet TK 1 T PO TID WF OR MILK 01/05/18   [provider]  FLUoxetine (PROZAC) 40 MG capsule TK 1 C PO QD 02/18/18   [provider]  Fluticasone-Umeclidin-Vilant (TRELEGY ELLIPTA) 200-62.5-25 MCG/ACT AEPB Inhale 1 puff into the lungs daily. 03/16/23   Glenford Bayley, NP  gabapentin (NEURONTIN) 400 MG capsule TK 2 CS PO HS 03/11/18   [provider]  gabapentin (NEURONTIN) 800 MG tablet TK 1 T PO HS 02/18/18   [provider]  levocetirizine (XYZAL) 5 MG tablet TK 1 T PO ONCE QPM 02/28/18   [provider]  levothyroxine (SYNTHROID, LEVOTHROID) 50 MCG tablet TK 1 T PO QD 02/18/18   [provider]  methocarbamol (ROBAXIN) 500 MG tablet Take 500 mg by mouth every 8 (eight) hours as needed for muscle  spasms (usually takes twice a week).    [provider]  mometasone (NASONEX) 50 MCG/ACT nasal spray  03/30/18   [provider]  montelukast (SINGULAIR) 10 MG tablet TK 1 T PO ONCE A DAY IN THE EVE 03/18/18   [provider]  traMADol (ULTRAM) 50 MG tablet  03/30/18   [provider]  trazodone (DESYREL) 300 MG tablet TK 1 T PO HS PRN 02/21/18   [provider]  valACYclovir (VALTREX) 1000 MG tablet TK 1 T PO TID PRN DURING FLARE 01/15/18   [provider]      Allergies    Patient has no known allergies.    Review of Systems   Review of Systems  Physical Exam Updated Vital Signs BP 130/83   Pulse 96   Temp 98.5 F (36.9 C) (Oral)   Resp 16   SpO2 96%  Physical Exam  ED Results / Procedures / Treatments   Labs (all labs ordered are listed, but only abnormal results are displayed) Labs Reviewed  URINALYSIS, ROUTINE W REFLEX MICROSCOPIC  BASIC METABOLIC PANEL  CBC WITH DIFFERENTIAL/PLATELET    EKG None  Radiology DG Abdomen 1 View Result Date: 09/21/2023 CLINICAL DATA:  Fecal impaction, no bowel movement for 3 days, lower abdominal pain EXAM: ABDOMEN - 1 VIEW COMPARISON:  None Available. FINDINGS: Three supine frontal views of the abdomen and pelvis are obtained. No  bowel obstruction or ileus. There is a large amount of retained stool within the rectal vault compatible with fecal impaction. No masses or abnormal calcifications. Lung bases are clear. No acute bony abnormalities. IMPRESSION: 1. Large amount of retained stool in the rectal vault consistent with fecal impaction. No obstruction or ileus. Electronically Signed   By: Sharlet Salina M.D.   On: 09/21/2023 20:47    Procedures .Fecal disimpaction  Date/Time: 09/21/2023 11:17 PM  Performed by: Arletha Pili, DO Authorized by: Arletha Pili, DO  Consent: Verbal consent obtained. Consent given by: patient Patient consent: the patient's understanding of the  procedure matches consent given Procedure consent: procedure consent matches procedure scheduled Patient identity confirmed: verbally with patient Local anesthesia used: no  Anesthesia: Local anesthesia used: no  Sedation: Patient sedated: yes Sedation type: anxiolysis Sedatives: midazolam Analgesia: fentanyl  Patient tolerance: patient tolerated the procedure well with no immediate complications       Medications Ordered in ED Medications  polyethylene glycol (MIRALAX / GLYCOLAX) packet 34 g (has no administration in time range)  docusate sodium (COLACE) capsule 100 mg (has no administration in time range)  morphine (PF) 4 MG/ML injection 4 mg (4 mg Intravenous Given 09/21/23 2017)  lidocaine (XYLOCAINE) 2 % jelly 1 Application (1 Application Topical Given 09/21/23 2057)  LORazepam (ATIVAN) tablet 1 mg (1 mg Oral Given 09/21/23 2202)  midazolam (VERSED) injection 1 mg (1 mg Intravenous Given 09/21/23 2219)  fentaNYL (SUBLIMAZE) injection 100 mcg (100 mcg Intravenous Given 09/21/23 2235)    ED Course/ Medical Decision Making/ A&P                                 Medical Decision Making 63 year old female here today with fecal impaction.  Plan-did perform sedation, manually disimpact patient, provide her with an enema.  Was able to remove a moderate amount of stool.  Patient tolerated the procedure, but did have a fair amount of discomfort despite fentanyl, Versed.  She is going to attempt have a bowel movement for [place] here after her disimpaction.  Reassessment 11:15 PM-on 4 unfortunately, patient is unable to have a large bowel movement.  Have ordered bowel regimen for the patient of MiraLAX, Colace.  Will admit patient to hospitalist for fecal impaction.  Amount and/or Complexity of Data Reviewed Labs: ordered.  Risk OTC drugs. Prescription drug management.           Final Clinical Impression(s) / ED Diagnoses Final diagnoses:  Fecal impaction [facility])    Rx / DC  Orders ED Discharge Orders     None         Arletha Pili, DO 09/21/23 2321

## 2023-09-21 NOTE — ED Notes (Signed)
Pt is on the bedside commode.

## 2023-09-21 NOTE — ED Provider Triage Note (Signed)
Emergency Medicine Provider Triage Evaluation Note  Rachel Fuentes , a 63 y.o. female  was evaluated in triage.  Pt complains of fecal impaction.  Patient reports she recently had surgery so has been taking pain medication.  She states that she has not had bowel in the past 3 days.  She endorses lower abdominal pain.  Has tried to disimpact herself earlier today but was unsuccessful.  Endorses pain at her anus as well.  Additionally she is concerned that she might have a UTI.  Review of Systems  Positive: Constipation, abdominal pain Negative: Diarrhea, fevers  Physical Exam  BP 124/76 (BP Location: Left Arm)   Pulse (!) 112   Temp 98.5 F (36.9 C) (Oral)   Resp 16   SpO2 99%  Gen:   Awake, no distress   Resp:  Normal effort  MSK:   Moves extremities without difficulty  Other:    Medical Decision Making  Medically screening exam initiated at 8:08 PM.  Appropriate orders placed.  Rachel Fuentes was informed that the remainder of the evaluation will be completed by another provider, this initial triage assessment does not replace that evaluation, and the importance of remaining in the ED until their evaluation is complete.    Rachel Marion, PA-C 09/21/23 2009

## 2023-09-21 NOTE — ED Triage Notes (Signed)
Pt arrives with c/o constipation x 2 hours. Pt states she manually removed stool and tried to do an enema at home. No bowel movement in 3 days. Pt also c/o nausea and urinary urgency.

## 2023-09-22 ENCOUNTER — Encounter (HOSPITAL_COMMUNITY): Payer: Self-pay | Admitting: Internal Medicine

## 2023-09-22 DIAGNOSIS — K5900 Constipation, unspecified: Secondary | ICD-10-CM | POA: Diagnosis not present

## 2023-09-22 LAB — BASIC METABOLIC PANEL
Anion gap: 12 (ref 5–15)
BUN: 15 mg/dL (ref 8–23)
CO2: 23 mmol/L (ref 22–32)
Calcium: 9.3 mg/dL (ref 8.9–10.3)
Chloride: 103 mmol/L (ref 98–111)
Creatinine, Ser: 1.02 mg/dL — ABNORMAL HIGH (ref 0.44–1.00)
GFR, Estimated: >60 mL/min (ref >60)
Glucose, Bld: 133 mg/dL — ABNORMAL HIGH (ref 70–99)
Potassium: 3.7 mmol/L (ref 3.5–5.1)
Sodium: 138 mmol/L (ref 135–145)

## 2023-09-22 LAB — CBC WITH DIFFERENTIAL/PLATELET
Abs Immature Granulocytes: 0.03 10*3/uL (ref 0.00–0.07)
Basophils Absolute: 0 10*3/uL (ref 0.0–0.1)
Basophils Relative: 0 %
Eosinophils Absolute: 0 10*3/uL (ref 0.0–0.5)
Eosinophils Relative: 0 %
HCT: 37.4 % (ref 36.0–46.0)
Hemoglobin: 12.6 g/dL (ref 12.0–15.0)
Immature Granulocytes: 0 %
Lymphocytes Relative: 8 %
Lymphs Abs: 0.8 10*3/uL (ref 0.7–4.0)
MCH: 29.9 pg (ref 26.0–34.0)
MCHC: 33.7 g/dL (ref 30.0–36.0)
MCV: 88.6 fL (ref 80.0–100.0)
Monocytes Absolute: 0.8 10*3/uL (ref 0.1–1.0)
Monocytes Relative: 8 %
Neutro Abs: 8.3 10*3/uL — ABNORMAL HIGH (ref 1.7–7.7)
Neutrophils Relative %: 84 %
Platelets: 242 10*3/uL (ref 150–400)
RBC: 4.22 MIL/uL (ref 3.87–5.11)
RDW: 12.4 % (ref 11.5–15.5)
WBC: 10 10*3/uL (ref 4.0–10.5)
nRBC: 0 % (ref 0.0–0.2)

## 2023-09-22 LAB — COMPREHENSIVE METABOLIC PANEL
ALT: 18 U/L (ref 0–44)
AST: 23 U/L (ref 15–41)
Albumin: 3.5 g/dL (ref 3.5–5.0)
Alkaline Phosphatase: 56 U/L (ref 38–126)
Anion gap: 8 (ref 5–15)
BUN: 15 mg/dL (ref 8–23)
CO2: 23 mmol/L (ref 22–32)
Calcium: 8.7 mg/dL — ABNORMAL LOW (ref 8.9–10.3)
Chloride: 103 mmol/L (ref 98–111)
Creatinine, Ser: 0.78 mg/dL (ref 0.44–1.00)
GFR, Estimated: >60 mL/min (ref >60)
Glucose, Bld: 134 mg/dL — ABNORMAL HIGH (ref 70–99)
Potassium: 3.7 mmol/L (ref 3.5–5.1)
Sodium: 134 mmol/L — ABNORMAL LOW (ref 135–145)
Total Bilirubin: 1.1 mg/dL (ref 0.0–1.2)
Total Protein: 6.5 g/dL (ref 6.5–8.1)

## 2023-09-22 LAB — CBC
HCT: 36.2 % (ref 36.0–46.0)
Hemoglobin: 12.1 g/dL (ref 12.0–15.0)
MCH: 29.8 pg (ref 26.0–34.0)
MCHC: 33.4 g/dL (ref 30.0–36.0)
MCV: 89.2 fL (ref 80.0–100.0)
Platelets: 250 10*3/uL (ref 150–400)
RBC: 4.06 MIL/uL (ref 3.87–5.11)
RDW: 12.6 % (ref 11.5–15.5)
WBC: 12.3 10*3/uL — ABNORMAL HIGH (ref 4.0–10.5)
nRBC: 0 % (ref 0.0–0.2)

## 2023-09-22 LAB — TSH: TSH: 2.887 u[IU]/mL (ref 0.350–4.500)

## 2023-09-22 LAB — HIV ANTIBODY (ROUTINE TESTING W REFLEX): HIV Screen 4th Generation wRfx: NONREACTIVE

## 2023-09-22 MED ORDER — SENNOSIDES-DOCUSATE SODIUM 8.6-50 MG PO TABS
1.0000 | ORAL_TABLET | Freq: Two times a day (BID) | ORAL | Status: DC
  Administered 2023-09-25: 1 via ORAL
  Filled 2023-09-22 (×5): qty 1

## 2023-09-22 MED ORDER — GABAPENTIN 400 MG PO CAPS: 800.0000 mg | ORAL_CAPSULE | Freq: Every day | ORAL | Status: DC

## 2023-09-22 MED ORDER — LEVOTHYROXINE SODIUM 50 MCG PO TABS
50.0000 ug | ORAL_TABLET | Freq: Every day | ORAL | Status: DC
  Administered 2023-09-23 – 2023-09-25 (×2): 50 ug via ORAL
  Filled 2023-09-22 (×2): qty 1

## 2023-09-22 MED ORDER — LIDOCAINE HCL URETHRAL/MUCOSAL 2 % EX GEL
1.0000 | CUTANEOUS | Status: DC | PRN
  Administered 2023-09-22 – 2023-09-23 (×2): 1 via TOPICAL
  Filled 2023-09-22: qty 5
  Filled 2023-09-22: qty 11
  Filled 2023-09-22: qty 5

## 2023-09-22 MED ORDER — GABAPENTIN 100 MG PO CAPS
300.0000 mg | ORAL_CAPSULE | Freq: Three times a day (TID) | ORAL | Status: DC
  Administered 2023-09-22 – 2023-09-25 (×8): 300 mg via ORAL
  Filled 2023-09-22 (×3): qty 3
  Filled 2023-09-22: qty 1
  Filled 2023-09-22 (×5): qty 3

## 2023-09-22 MED ORDER — POLYETHYLENE GLYCOL 3350 17 G PO PACK
17.0000 g | PACK | Freq: Two times a day (BID) | ORAL | Status: DC
  Administered 2023-09-22 – 2023-09-25 (×6): 17 g via ORAL
  Filled 2023-09-22 (×6): qty 1

## 2023-09-22 MED ORDER — ACETAMINOPHEN 325 MG PO TABS
650.0000 mg | ORAL_TABLET | Freq: Four times a day (QID) | ORAL | Status: DC | PRN
  Administered 2023-09-22 – 2023-09-23 (×2): 650 mg via ORAL
  Filled 2023-09-22 (×2): qty 2

## 2023-09-22 NOTE — Progress Notes (Signed)
PROGRESS NOTE  Rachel Fuentes  ZOX:[id number] DOB: 07/02/1961 DOA: 09/21/2023 PCP: Camie Patience, FNP   Brief Narrative: Patient is a 63 year old female with history of asthma, hypothyroidism, depression, anxiety who presented with complaint of lack of bowel movement, nausea.  Abdominal imaging showed massive amount of stool throughout the colon.  Patient admitted for the management of severe constipation.  Finally started having bowel movement after aggressive bowel regimen  Assessment & Plan:  Principal Problem:   Obstipation Active Problems:   Moderate persistent asthma without complication   Fibromyalgia   Gastroparesis   Severe constipation: Presented with abdominal pain, distention, nausea.  Imaging showed severe constipation.    Continue aggressive bowel regimen.  Started on GoLytely.  Added MiraLAX, Senokot.  Already had 3 large bowel movements since this morning.  Complains of soreness  around anal area, ordered lidocaine jelly  Moderate persistent asthma: Continue bronchodilators as needed.  Currently on room air.  Takes inhalers at home  Fibromyalgia: On chronic pain medications.  Most likely contributing to the constipation.  Restless leg symptoms: On gabapentin  Hypothyroidism: Continue Synthyroid  Urinary frequency: Will get UA before restarting any antibiotics.  She has mild leukocytosis as well.  No fever        DVT prophylaxis:enoxaparin (LOVENOX) injection 40 mg Start: 09/22/23 1000     Code Status: Full Code  Family Communication: None at bedside  Patient status:Inpatient  Patient is from :home  Anticipated discharge WJ:XBJY  Estimated DC date:tomorrow   Consultants: None  Procedures:None  Antimicrobials:  Anti-infectives (From admission, onward)    None       Subjective: Patient seen and examined at the bedside today.  Given my evaluation, she was slightly uncomfortable due to discomfort on the rectal area.  As per the  report, she had few bowel movements since last night.  Her abdomen is soft, nondistended, nontender.  No nausea or vomiting.  Bowel sounds present.  Objective: Vitals:   09/22/23 0450 09/22/23 0500 09/22/23 0600 09/22/23 0630  BP: 102/62 100/67 108/68   Pulse: (!) 107 (!) 111 (!) 109 (!) 102  Resp: 16     Temp: (!) 100.4 F (38 C)     TempSrc: Oral     SpO2: 95% 95% 92% 93%  Weight: 76.2 kg     Height: 5\' 11"  (1.803 m)      No intake or output data in the 24 hours ending 09/22/23 0753 Filed Weights   09/22/23 0450  Weight: 76.2 kg    Examination:  General exam: Overall comfortable, not in distress HEENT: PERRL Respiratory system:  no wheezes or crackles  Cardiovascular system: S1 & S2 heard, RRR.  Gastrointestinal system: Abdomen is nondistended, soft and nontender. Central nervous system: Alert and oriented Extremities: No edema, no clubbing ,no cyanosis Skin: No rashes, no ulcers,no icterus     Data Reviewed: I have personally reviewed following labs and imaging studies  CBC: Recent Labs  Lab 09/21/23 2352 09/22/23 0435  WBC 10.0 12.3*  NEUTROABS 8.3*  --   HGB 12.6 12.1  HCT 37.4 36.2  MCV 88.6 89.2  PLT 242 250   Basic Metabolic Panel: Recent Labs  Lab 09/21/23 2352 09/22/23 0435  NA 138 134*  K 3.7 3.7  CL 103 103  CO2 23 23  GLUCOSE 133* 134*  BUN 15 15  CREATININE 1.02* 0.78  CALCIUM 9.3 8.7*     No results found for this or any previous visit (from the past 240  hours).   Radiology Studies: DG Abdomen 1 View Result Date: 09/21/2023 CLINICAL DATA:  Fecal impaction, no bowel movement for 3 days, lower abdominal pain EXAM: ABDOMEN - 1 VIEW COMPARISON:  None Available. FINDINGS: Three supine frontal views of the abdomen and pelvis are obtained. No bowel obstruction or ileus. There is a large amount of retained stool within the rectal vault compatible with fecal impaction. No masses or abnormal calcifications. Lung bases are clear. No acute bony  abnormalities. IMPRESSION: 1. Large amount of retained stool in the rectal vault consistent with fecal impaction. No obstruction or ileus. Electronically Signed   By: Sharlet Salina M.D.   On: 09/21/2023 20:47    Scheduled Meds:  enoxaparin (LOVENOX) injection  40 mg Subcutaneous Q24H   levothyroxine  50 mcg Oral Q0600   polyethylene glycol-electrolytes  4,000 mL Oral Once   Continuous Infusions:  sodium chloride 100 mL/hr at 09/22/23 0007     LOS: 1 day   Burnadette Pop, MD [facility] P2/07/2024, 7:53 AM

## 2023-09-22 NOTE — ED Notes (Signed)
Patient said she has been unable to have a plain urinalysis sample because she has been having stools at the same time

## 2023-09-23 DIAGNOSIS — K5900 Constipation, unspecified: Secondary | ICD-10-CM | POA: Diagnosis not present

## 2023-09-23 LAB — CBC
HCT: 31.9 % — ABNORMAL LOW (ref 36.0–46.0)
Hemoglobin: 10.5 g/dL — ABNORMAL LOW (ref 12.0–15.0)
MCH: 30 pg (ref 26.0–34.0)
MCHC: 32.9 g/dL (ref 30.0–36.0)
MCV: 91.1 fL (ref 80.0–100.0)
Platelets: 215 10*3/uL (ref 150–400)
RBC: 3.5 MIL/uL — ABNORMAL LOW (ref 3.87–5.11)
RDW: 12.9 % (ref 11.5–15.5)
WBC: 8.3 10*3/uL (ref 4.0–10.5)
nRBC: 0 % (ref 0.0–0.2)

## 2023-09-23 MED ORDER — SMOG ENEMA
960.0000 mL | Freq: Once | RECTAL | Status: AC
  Administered 2023-09-23: 960 mL via RECTAL
  Filled 2023-09-23: qty 960

## 2023-09-23 NOTE — Progress Notes (Signed)
PROGRESS NOTE  Rachel Fuentes  ZOX:[id number] DOB: 03-12-1961 DOA: 09/21/2023 PCP: Camie Patience, FNP   Brief Narrative: Patient is a 63 year old female with history of asthma, hypothyroidism, depression, anxiety who presented with complaint of lack of bowel movement, nausea.  Abdominal imaging showed massive amount of stool throughout the colon.  Patient admitted for the management of severe constipation.  Trying aggressive bowel regimen, including smog enema  Assessment & Plan:  Principal Problem:   Obstipation Active Problems:   Moderate persistent asthma without complication   Fibromyalgia   Gastroparesis   Severe constipation: Presented with abdominal pain, distention, nausea.  Imaging showed severe constipation.    Continue aggressive bowel regimen.  Given GoLytely..  Added MiraLAX, Senokot.  Now having liquidy bowel movement and again feels something stuck.  She cannot tolerate manual disimpaction.  We discussed with GI, Dr. Bosie Clos who recommended to try smog enema today and again call tomorrow if continues to have constipation. Continue aggressive bowel regimen  Moderate persistent asthma: Continue bronchodilators as needed.  Currently on room air.  Takes inhalers at home  Fibromyalgia: On chronic pain medications.  Most likely contributing to the constipation.  Restless leg symptoms: On gabapentin  Hypothyroidism: Continue Synthyroid        DVT prophylaxis:enoxaparin (LOVENOX) injection 40 mg Start: 09/22/23 1000     Code Status: Full Code  Family Communication: None at bedside  Patient status:Inpatient  Patient is from :home  Anticipated discharge WJ:XBJY  Estimated DC date: After resolution of constipation   Consultants: None  Procedures:None  Antimicrobials:  Anti-infectives (From admission, onward)    None       Subjective: Patient seen and examined at bedside today.  Hemodynamically stable. She is sick and have severe rectal  discomfort today.  She feels that something has been stuck.  She declines manual disimpaction because of severe pain.  We discussed about requesting for GI consultation.  Decision made to order enema   Objective: Vitals:   09/22/23 1707 09/22/23 2002 09/23/23 0209 09/23/23 0610  BP: (!) 133/91 (!) 148/90 124/72 (!) 133/96  Pulse: 86 87 78 95  Resp:  19 16 17   Temp: 98 F (36.7 C) 98.9 F (37.2 C) 98.9 F (37.2 C) 98.5 F (36.9 C)  TempSrc: Oral Oral Oral Oral  SpO2: 96% 100% 98% 96%  Weight:      Height:        Intake/Output Summary (Last 24 hours) at 09/23/2023 1055 Last data filed at 09/23/2023 7829 Gross per 24 hour  Intake 3265.92 ml  Output --  Net 3265.92 ml   Filed Weights   09/22/23 0450  Weight: 76.2 kg    Examination:  General exam: Uncomfortable due to rectal discomfort  HEENT: PERRL Respiratory system:  no wheezes or crackles  Cardiovascular system: S1 & S2 heard, RRR.  Gastrointestinal system: Abdomen is nondistended, soft and nontender. Central nervous system: Alert and oriented Extremities: No edema, no clubbing ,no cyanosis Skin: No rashes, no ulcers,no icterus     Data Reviewed: I have personally reviewed following labs and imaging studies  CBC: Recent Labs  Lab 09/21/23 2352 09/22/23 0435 09/23/23 0509  WBC 10.0 12.3* 8.3  NEUTROABS 8.3*  --   --   HGB 12.6 12.1 10.5*  HCT 37.4 36.2 31.9*  MCV 88.6 89.2 91.1  PLT 242 250 215   Basic Metabolic Panel: Recent Labs  Lab 09/21/23 2352 09/22/23 0435  NA 138 134*  K 3.7 3.7  CL 103 103  CO2 23 23  GLUCOSE 133* 134*  BUN 15 15  CREATININE 1.02* 0.78  CALCIUM 9.3 8.7*     No results found for this or any previous visit (from the past 240 hours).   Radiology Studies: DG Abdomen 1 View Result Date: 09/21/2023 CLINICAL DATA:  Fecal impaction, no bowel movement for 3 days, lower abdominal pain EXAM: ABDOMEN - 1 VIEW COMPARISON:  None Available. FINDINGS: Three supine frontal views of  the abdomen and pelvis are obtained. No bowel obstruction or ileus. There is a large amount of retained stool within the rectal vault compatible with fecal impaction. No masses or abnormal calcifications. Lung bases are clear. No acute bony abnormalities. IMPRESSION: 1. Large amount of retained stool in the rectal vault consistent with fecal impaction. No obstruction or ileus. Electronically Signed   By: Sharlet Salina M.D.   On: 09/21/2023 20:47    Scheduled Meds:  enoxaparin (LOVENOX) injection  40 mg Subcutaneous Q24H   gabapentin  300 mg Oral TID   levothyroxine  50 mcg Oral QAC breakfast   polyethylene glycol  17 g Oral BID   senna-docusate  1 tablet Oral BID   Continuous Infusions:     LOS: 1 day   Burnadette Pop, MD [facility] P2/13/2025, 10:55 AM

## 2023-09-23 NOTE — Progress Notes (Signed)
Patient given SMOG enema and had small amount of stool as the result.

## 2023-09-23 NOTE — Plan of Care (Signed)
Problem: Education: Goal: Knowledge of General Education information will improve Description: Including pain rating scale, medication(s)/side effects and non-pharmacologic comfort measures Outcome: Progressing   Problem: Coping: Goal: Level of anxiety will decrease Outcome: Not Progressing

## 2023-09-23 NOTE — TOC CM/SW Note (Signed)
Transition of Care [facility]) - Inpatient Brief Assessment   Patient Details  Name: Rachel Fuentes MRN: [id number] Date of Birth: 10-10-1960  [facility] of [facility]) CM/SW Contact:    Howell Rucks, RN Phone Number: 09/23/2023, 11:52 AM   Clinical Narrative: PCP on file,  resides in private residence, spouse on file for contact, anticipate dc home. No TOC needs identified at this time.     Transition of Care Asessment: Insurance and Status: Insurance coverage has been reviewed Patient has primary care physician: Yes Home environment has been reviewed: resides in private residence Prior level of function:: Independent Prior/Current Home Services: No current home services Social Drivers of Health Review: SDOH reviewed no interventions necessary Readmission risk has been reviewed: Yes Transition of care needs: no transition of care needs at this time

## 2023-09-23 NOTE — Plan of Care (Signed)
Problem: Nutrition: Goal: Adequate nutrition will be maintained Outcome: Progressing   Problem: Coping: Goal: Level of anxiety will decrease Outcome: Progressing

## 2023-09-24 ENCOUNTER — Encounter (HOSPITAL_COMMUNITY): Payer: Self-pay | Admitting: Internal Medicine

## 2023-09-24 ENCOUNTER — Encounter (HOSPITAL_COMMUNITY)
Admission: EM | Disposition: A | Discharge status: Home / Self Care | Payer: Self-pay | Source: Home / Self Care | Attending: Emergency Medicine

## 2023-09-24 ENCOUNTER — Observation Stay (HOSPITAL_COMMUNITY): Payer: 59 | Admitting: Certified Registered"

## 2023-09-24 ENCOUNTER — Observation Stay (HOSPITAL_COMMUNITY): Payer: 59

## 2023-09-24 DIAGNOSIS — K5641 Fecal impaction: Principal | ICD-10-CM | POA: Diagnosis present

## 2023-09-24 DIAGNOSIS — K640 First degree hemorrhoids: Secondary | ICD-10-CM | POA: Diagnosis not present

## 2023-09-24 DIAGNOSIS — K626 Ulcer of anus and rectum: Secondary | ICD-10-CM | POA: Diagnosis not present

## 2023-09-24 DIAGNOSIS — K5730 Diverticulosis of large intestine without perforation or abscess without bleeding: Secondary | ICD-10-CM | POA: Diagnosis not present

## 2023-09-24 DIAGNOSIS — J45909 Unspecified asthma, uncomplicated: Secondary | ICD-10-CM | POA: Diagnosis not present

## 2023-09-24 DIAGNOSIS — K5900 Constipation, unspecified: Secondary | ICD-10-CM | POA: Diagnosis not present

## 2023-09-24 HISTORY — PX: FLEXIBLE SIGMOIDOSCOPY: SHX5431

## 2023-09-24 HISTORY — PX: BIOPSY: SHX5522

## 2023-09-24 LAB — BASIC METABOLIC PANEL
Anion gap: 13 (ref 5–15)
BUN: <5 mg/dL — ABNORMAL LOW (ref 8–23)
CO2: 23 mmol/L (ref 22–32)
Calcium: 9.1 mg/dL (ref 8.9–10.3)
Chloride: 104 mmol/L (ref 98–111)
Creatinine, Ser: 0.76 mg/dL (ref 0.44–1.00)
GFR, Estimated: >60 mL/min (ref >60)
Glucose, Bld: 86 mg/dL (ref 70–99)
Potassium: 3.2 mmol/L — ABNORMAL LOW (ref 3.5–5.1)
Sodium: 140 mmol/L (ref 135–145)

## 2023-09-24 LAB — CBC
HCT: 34 % — ABNORMAL LOW (ref 36.0–46.0)
Hemoglobin: 11.2 g/dL — ABNORMAL LOW (ref 12.0–15.0)
MCH: 29 pg (ref 26.0–34.0)
MCHC: 32.9 g/dL (ref 30.0–36.0)
MCV: 88.1 fL (ref 80.0–100.0)
Platelets: 241 10*3/uL (ref 150–400)
RBC: 3.86 MIL/uL — ABNORMAL LOW (ref 3.87–5.11)
RDW: 12.2 % (ref 11.5–15.5)
WBC: 7 10*3/uL (ref 4.0–10.5)
nRBC: 0 % (ref 0.0–0.2)

## 2023-09-24 SURGERY — SIGMOIDOSCOPY, FLEXIBLE
Anesthesia: Monitor Anesthesia Care

## 2023-09-24 MED ORDER — MESALAMINE 1000 MG RE SUPP
500.0000 mg | Freq: Two times a day (BID) | RECTAL | Status: DC
  Administered 2023-09-24 – 2023-09-25 (×2): 500 mg via RECTAL
  Filled 2023-09-24 (×2): qty 1

## 2023-09-24 MED ORDER — FENTANYL CITRATE (PF) 100 MCG/2ML IJ SOLN: 25.0000 ug | INTRAMUSCULAR | Status: DC | PRN

## 2023-09-24 MED ORDER — OXYCODONE HCL 5 MG PO TABS: 5.0000 mg | ORAL_TABLET | Freq: Once | ORAL | Status: DC | PRN

## 2023-09-24 MED ORDER — POTASSIUM CHLORIDE CRYS ER 20 MEQ PO TBCR
40.0000 meq | EXTENDED_RELEASE_TABLET | Freq: Once | ORAL | Status: AC
  Administered 2023-09-24: 40 meq via ORAL
  Filled 2023-09-24: qty 2

## 2023-09-24 MED ORDER — ACETAMINOPHEN 10 MG/ML IV SOLN: 1000.0000 mg | Freq: Once | INTRAVENOUS | Status: DC | PRN

## 2023-09-24 MED ORDER — SODIUM CHLORIDE 0.9 % IV SOLN: INTRAVENOUS | Status: DC

## 2023-09-24 MED ORDER — OXYCODONE HCL 5 MG/5ML PO SOLN: 5.0000 mg | Freq: Once | ORAL | Status: DC | PRN

## 2023-09-24 MED ORDER — PROPOFOL 500 MG/50ML IV EMUL
INTRAVENOUS | Status: DC | PRN
  Administered 2023-09-24: 70 ug/kg/min via INTRAVENOUS

## 2023-09-24 MED ORDER — DROPERIDOL 2.5 MG/ML IJ SOLN: 0.6250 mg | Freq: Once | INTRAMUSCULAR | Status: DC | PRN

## 2023-09-24 MED ORDER — TRAZODONE HCL 100 MG PO TABS
100.0000 mg | ORAL_TABLET | Freq: Every day | ORAL | Status: DC
  Administered 2023-09-24: 100 mg via ORAL
  Filled 2023-09-24: qty 1

## 2023-09-24 MED ORDER — SODIUM CHLORIDE 0.9 % IV SOLN
INTRAVENOUS | Status: DC | PRN
Start: 2023-09-24 — End: 2023-09-24

## 2023-09-24 NOTE — Transfer of Care (Signed)
Immediate Anesthesia Transfer of Care Note  Patient: Shontell Prosser  Procedure(s) Performed: FLEXIBLE SIGMOIDOSCOPY BIOPSY  Patient Location: PACU and Endoscopy Unit  Anesthesia Type:MAC  Level of Consciousness: awake, alert , and oriented  Airway & Oxygen Therapy: Patient Spontanous Breathing  Post-op Assessment: Report given to RN and Post -op Vital signs reviewed and stable  Post vital signs: Reviewed and stable  Last Vitals:  Vitals Value Taken Time  BP 98/45 09/24/23 1518  Temp    Pulse 72 09/24/23 1520  Resp 10 09/24/23 1520  SpO2 96 % 09/24/23 1520  Vitals shown include unfiled device data.  Last Pain:  Vitals:   09/24/23 1518  TempSrc:   PainSc: 0-No pain      Patients Stated Pain Goal: 0 (09/24/23 0858)  Complications: No notable events documented.

## 2023-09-24 NOTE — Plan of Care (Signed)
Problem: Health Behavior/Discharge Planning: Goal: Ability to manage health-related needs will improve Outcome: Progressing   Problem: Clinical Measurements: Goal: Ability to maintain clinical measurements within normal limits will improve Outcome: Progressing   Problem: Clinical Measurements: Goal: Will remain free from infection Outcome: Progressing

## 2023-09-24 NOTE — Interval H&P Note (Signed)
History and Physical Interval Note:  09/24/2023 2:50 PM  Rachel Fuentes  has presented today for surgery, with the diagnosis of Fecal Impaction.  The various methods of treatment have been discussed with the patient and family. After consideration of risks, benefits and other options for treatment, the patient has consented to  Procedure(s): FLEXIBLE SIGMOIDOSCOPY (N/A) as a surgical intervention.  The patient's history has been reviewed, patient examined, no change in status, stable for surgery.  I have reviewed the patient's chart and labs.  Questions were answered to the patient's satisfaction.     Shirley Friar

## 2023-09-24 NOTE — Anesthesia Postprocedure Evaluation (Signed)
Anesthesia Post Note  Patient: Rachel Fuentes  Procedure(s) Performed: FLEXIBLE SIGMOIDOSCOPY BIOPSY     Patient location during evaluation: PACU Anesthesia Type: MAC Level of consciousness: awake and alert Pain management: pain level controlled Vital Signs Assessment: post-procedure vital signs reviewed and stable Respiratory status: spontaneous breathing, nonlabored ventilation, respiratory function stable and patient connected to nasal cannula oxygen Cardiovascular status: stable and blood pressure returned to baseline Postop Assessment: no apparent nausea or vomiting Anesthetic complications: no   No notable events documented.  Last Vitals:  Vitals:   09/24/23 1530 09/24/23 1556  BP: 113/74 133/81  Pulse: 66 62  Resp: (!) 24 14  Temp:  36.5 C  SpO2: 97% 100%    Last Pain:  Vitals:   09/24/23 1556  TempSrc: Oral  PainSc:                  [place]

## 2023-09-24 NOTE — Anesthesia Preprocedure Evaluation (Signed)
Anesthesia Evaluation  Patient identified by MRN, date of birth, ID band Patient awake    Reviewed: Allergy & Precautions, H&P , NPO status , Patient's Chart, lab work & pertinent test results  Airway Mallampati: II  TM Distance: >3 FB Neck ROM: Full    Dental no notable dental hx.    Pulmonary asthma    Pulmonary exam normal breath sounds clear to auscultation       Cardiovascular negative cardio ROS Normal cardiovascular exam Rhythm:Regular Rate:Normal     Neuro/Psych negative neurological ROS  negative psych ROS   GI/Hepatic Neg liver ROS,,,Fecal impaction   Endo/Other  negative endocrine ROS    Renal/GU negative Renal ROS  negative genitourinary   Musculoskeletal  (+)  Fibromyalgia -  Abdominal   Peds negative pediatric ROS (+)  Hematology negative hematology ROS (+)   Anesthesia Other Findings   Reproductive/Obstetrics negative OB ROS                              Anesthesia Physical Anesthesia Plan  ASA: 3  Anesthesia Plan: MAC   Post-op Pain Management:    Induction: Intravenous  PONV Risk Score and Plan: Propofol infusion and Treatment may vary due to age or medical condition  Airway Management Planned: Natural Airway  Additional Equipment:   Intra-op Plan:   Post-operative Plan:   Informed Consent: I have reviewed the patients History and Physical, chart, labs and discussed the procedure including the risks, benefits and alternatives for the proposed anesthesia with the patient or authorized representative who has indicated his/her understanding and acceptance.     Dental advisory given  Plan Discussed with: CRNA  Anesthesia Plan Comments:          Anesthesia Quick Evaluation

## 2023-09-24 NOTE — H&P (View-Only) (Signed)
Referring Provider: Dr. Renford Dills Primary Care Physician:  Camie Patience, FNP Primary Gastroenterologist:  Dr. Dulce Sellar  Reason for Consultation:  Fecal Impaction  HPI: Rachel Fuentes is a 63 y.o. female with onset of obstipation over the past week while on pain meds for her right foot. Unable to disimpact herself at home. Small stool particles and bleeding occurred with digital disimpaction at home. No significant results with NuLytely and with smog enemas and suppositories. Last colonoscopy in 2021 showed diverticulosis and a hyperplastic polyp. Fecal impaction seen on imaging. Due to severe rectal pain did not tolerate attempt at bedside disimpaction by nursing.  Past Medical History:  Diagnosis Date   Asthma     History reviewed. No pertinent surgical history.  Prior to Admission medications   Medication Sig Start Date End Date Taking? Authorizing Provider  albuterol (PROVENTIL) (2.5 MG/3ML) 0.083% nebulizer solution Take 3 mLs (2.5 mg total) by nebulization every 6 (six) hours as needed for wheezing or shortness of breath. 07/06/22 09/22/23 Yes Olalere, Adewale A, MD  albuterol (VENTOLIN HFA) 108 (90 Base) MCG/ACT inhaler INHALE 2 PUFFS INTO THE LUNGS EVERY 6 HOURS AS NEEDED FOR WHEEZING OR SHORTNESS OF BREATH 07/31/22  Yes Olalere, Adewale A, MD  aspirin EC 81 MG tablet Take 81 mg by mouth 2 (two) times daily.   Yes [provider]  B Complex Vitamins (B COMPLEX PO) Take 1 capsule by mouth daily.   Yes [provider]  buPROPion (WELLBUTRIN XL) 300 MG 24 hr tablet Take 300 mg by mouth daily. 02/18/18  Yes [provider]  CALCIUM PO Take 1 tablet by mouth daily.   Yes [provider]  diclofenac (VOLTAREN) 50 MG EC tablet Take 50 mg by mouth 2 (two) times daily. 01/05/18  Yes [provider]  FLUoxetine (PROZAC) 40 MG capsule Take 80 mg by mouth daily. 02/18/18  Yes [provider]  Fluticasone-Umeclidin-Vilant (TRELEGY ELLIPTA)  200-62.5-25 MCG/ACT AEPB Inhale 1 puff into the lungs daily. 03/16/23  Yes Glenford Bayley, NP  gabapentin (NEURONTIN) 400 MG capsule Take 800 mg by mouth at bedtime. 03/11/18  Yes [provider]  HYDROcodone-acetaminophen (NORCO/VICODIN) 5-325 MG tablet Take 1 tablet by mouth daily as needed for severe pain (pain score 7-10) or moderate pain (pain score 4-6).   Yes [provider]  ipratropium (ATROVENT) 0.06 % nasal spray Place 2 sprays into the nose daily. 08/19/23  Yes [provider]  levocetirizine (XYZAL) 5 MG tablet Take 5 mg by mouth daily. 02/28/18  Yes [provider]  levothyroxine (SYNTHROID, LEVOTHROID) 50 MCG tablet Take 50 mcg by mouth daily before breakfast. 02/18/18  Yes [provider]  methocarbamol (ROBAXIN) 500 MG tablet Take 500 mg by mouth daily as needed for muscle spasms.   Yes [provider]  mometasone (NASONEX) 50 MCG/ACT nasal spray Place 1 spray into the nose daily. 03/30/18  Yes [provider]  montelukast (SINGULAIR) 10 MG tablet Take 10 mg by mouth at bedtime. 03/18/18  Yes [provider]  Multiple Vitamins-Minerals (MULTIVITAMIN WITH MINERALS) tablet Take 1 tablet by mouth daily.   Yes [provider]  traZODone (DESYREL) 100 MG tablet Take 200 mg by mouth at bedtime.   Yes [provider]  valACYclovir (VALTREX) 1000 MG tablet Take 1,000 mg by mouth 3 (three) times daily as needed (flares). 01/15/18  Yes [provider]  valACYclovir (VALTREX) 500 MG tablet Take 500 mg by mouth daily.   Yes [provider]    Scheduled Meds:  enoxaparin (LOVENOX) injection  40 mg Subcutaneous Q24H   gabapentin  300 mg Oral TID   levothyroxine  50 mcg Oral QAC breakfast   polyethylene glycol  17 g Oral BID   senna-docusate  1 tablet Oral BID   Continuous Infusions: PRN Meds:.acetaminophen, lidocaine, ondansetron **OR** ondansetron (ZOFRAN) IV  Allergies as of 09/21/2023    (No Known Allergies)    Family History  Problem Relation Age of Onset   Breast cancer Neg Hx     Social History   Socioeconomic History   Marital status: Married    Spouse name: Not on file   Number of children: Not on file   Years of education: Not on file   Highest education level: Not on file  Occupational History   Not on file  Tobacco Use   Smoking status: Never   Smokeless tobacco: Never  Substance and Sexual Activity   Alcohol use: Not on file   Drug use: Not on file   Sexual activity: Not on file  Other Topics Concern   Not on file  Social History Narrative   Not on file   Social Drivers of Health   Financial Resource Strain: Not on file  Food Insecurity: No Food Insecurity (09/22/2023)   Hunger Vital Sign    Worried About Running Out of Food in the Last Year: Never true    Ran Out of Food in the Last Year: Never true  Transportation Needs: No Transportation Needs (09/22/2023)   PRAPARE - Administrator, Civil Service (Medical): No    Lack of Transportation (Non-Medical): No  Physical Activity: Not on file  Stress: Not on file  Social Connections: Not on file  Intimate Partner Violence: Not At Risk (09/22/2023)   Humiliation, Afraid, Rape, and Kick questionnaire    Fear of Current or Ex-Partner: No    Emotionally Abused: No    Physically Abused: No    Sexually Abused: No    Review of Systems: All negative except as stated above in HPI.  Physical Exam: Vital signs: Vitals:   09/23/23 2246 09/24/23 0658  BP: 129/83 138/85  Pulse: 67 67  Resp: 14 16  Temp: 98.3 F (36.8 C) 98.5 F (36.9 C)  SpO2: 96% 98%   Last BM Date : 09/23/23 General:   elderly, well-nourished, no acute distress, pleasant Head: normocephalic, atraumatic Eyes: anicteric sclera ENT: oropharynx clear Neck: supple, nontender Lungs:  Clear throughout to auscultation.   No wheezes, crackles, or rhonchi. No acute distress. Heart:  Regular rate and rhythm; no murmurs,  clicks, rubs,  or gallops. Abdomen: soft, nontender, nondistended, +BS  Rectal:  Deferred Ext: no edema  GI:  Lab Results: Recent Labs    09/22/23 0435 09/23/23 0509 09/24/23 0513  WBC 12.3* 8.3 7.0  HGB 12.1 10.5* 11.2*  HCT 36.2 31.9* 34.0*  PLT 250 215 241   BMET Recent Labs    09/21/23 2352 09/22/23 0435 09/24/23 0513  NA 138 134* 140  K 3.7 3.7 3.2*  CL 103 103 104  CO2 23 23 23   GLUCOSE 133* 134* 86  BUN 15 15 <5*  CREATININE 1.02* 0.78 0.76  CALCIUM 9.3 8.7* 9.1   LFT Recent Labs    09/22/23 0435  PROT 6.5  ALBUMIN 3.5  AST 23  ALT 18  ALKPHOS 56  BILITOT 1.1   PT/INR No results for input(s): "LABPROT", "INR" in the last 72 hours.   Studies/Results:  No results found.  Impression/Plan: Fecal impaction that has not improved with laxatives and enemas. Repeat Xray pending. Will do flexible sigmoidoscopy today with attempt at disimpaction and if that is not successful will need surgical disimpaction under anesthesia.    LOS: 1 day   Rachel Fuentes  09/24/2023, 12:39 PM  Questions please call [phone]

## 2023-09-24 NOTE — Progress Notes (Signed)
PROGRESS NOTE  Rachel Fuentes  QMV:[id number] DOB: 1961/04/07 DOA: 09/21/2023 PCP: Camie Patience, FNP   Brief Narrative: Patient is a 63 year old female with history of asthma, hypothyroidism, depression, anxiety who presented with complaint of lack of bowel movement, nausea.  Abdominal imaging showed massive amount of stool throughout the colon.  Patient admitted for the management of severe constipation.  Tried aggressive bowel regimen, including smog enema.  But patient is still having discomfort, could not tolerate manual disimpaction, GI consulted, plan for flexible sigmoidoscopy today  Assessment & Plan:  Principal Problem:   Obstipation Active Problems:   Moderate persistent asthma without complication   Fibromyalgia   Gastroparesis   Severe constipation: Presented with abdominal pain, distention, nausea.  Imaging showed severe constipation.    Continue aggressive bowel regimen.  Given GoLytely..  Added MiraLAX, Senokot.  Now having liquidy bowel movement and but again feels something stuck.  She cannot tolerate manual disimpaction.  We discussed with GI, Dr. Bosie Clos who recommended to try smog enema.  Enema did not help either.  GI seeing her today and plan for flexible sigmoidoscopy.  Moderate persistent asthma: Continue bronchodilators as needed.  Currently on room air.  Takes inhalers at home  Fibromyalgia: On chronic pain medications.  Most likely contributing to the constipation.  Restless leg symptoms: On gabapentin  Hypothyroidism: Continue Synthyroid        DVT prophylaxis:enoxaparin (LOVENOX) injection 40 mg Start: 09/22/23 1000     Code Status: Full Code  Family Communication: None at bedside  Patient status:Inpatient  Patient is from :home  Anticipated discharge MW:UXLK  Estimated DC date: After resolution of constipation.  Likely tomorrow   Consultants: None  Procedures:None  Antimicrobials:  Anti-infectives (From admission, onward)     None       Subjective: Patient seen and examined at bedside today.  She feels slightly better today.  She was lying in bed.  Denies any abdomen pain, nausea or vomiting.  Abdomen is soft and nondistended but she still thinks that something is stuck on her rectum.  Having liquidy bowel movement without any solid bowel movement.  Objective: Vitals:   09/23/23 0610 09/23/23 1419 09/23/23 2246 09/24/23 0658  BP: (!) 133/96 (!) 142/85 129/83 138/85  Pulse: 95 73 67 67  Resp: 17 17 14 16   Temp: 98.5 F (36.9 C) 98.3 F (36.8 C) 98.3 F (36.8 C) 98.5 F (36.9 C)  TempSrc: Oral Oral Oral Oral  SpO2: 96% 99% 96% 98%  Weight:      Height:        Intake/Output Summary (Last 24 hours) at 09/24/2023 1208 Last data filed at 09/24/2023 1000 Gross per 24 hour  Intake 1140 ml  Output 450 ml  Net 690 ml   Filed Weights   09/22/23 0450  Weight: 76.2 kg    Examination:  General exam: Overall comfortable, not in distress HEENT: PERRL Respiratory system:  no wheezes or crackles  Cardiovascular system: S1 & S2 heard, RRR.  Gastrointestinal system: Abdomen is nondistended, soft and nontender. Central nervous system: Alert and oriented Extremities: No edema, no clubbing ,no cyanosis Skin: No rashes, no ulcers,no icterus    Data Reviewed: I have personally reviewed following labs and imaging studies  CBC: Recent Labs  Lab 09/21/23 2352 09/22/23 0435 09/23/23 0509 09/24/23 0513  WBC 10.0 12.3* 8.3 7.0  NEUTROABS 8.3*  --   --   --   HGB 12.6 12.1 10.5* 11.2*  HCT 37.4 36.2 31.9* 34.0*  MCV 88.6 89.2 91.1 88.1  PLT 242 250 215 241   Basic Metabolic Panel: Recent Labs  Lab 09/21/23 2352 09/22/23 0435 09/24/23 0513  NA 138 134* 140  K 3.7 3.7 3.2*  CL 103 103 104  CO2 23 23 23   GLUCOSE 133* 134* 86  BUN 15 15 <5*  CREATININE 1.02* 0.78 0.76  CALCIUM 9.3 8.7* 9.1     No results found for this or any previous visit (from the past 240 hours).   Radiology  Studies: No results found.   Scheduled Meds:  enoxaparin (LOVENOX) injection  40 mg Subcutaneous Q24H   gabapentin  300 mg Oral TID   levothyroxine  50 mcg Oral QAC breakfast   polyethylene glycol  17 g Oral BID   senna-docusate  1 tablet Oral BID   Continuous Infusions:     LOS: 1 day   Burnadette Pop, MD [facility] P2/14/2025, 12:08 PM

## 2023-09-24 NOTE — Op Note (Signed)
[facility] Patient Name: Rachel Fuentes Procedure Date: 09/24/2023 MRN: [id number] Attending MD: Shirley Friar , MD, [id number] Date of Birth: May 02, 1961 CSN: [id number] Age: 63 Admit Type: Inpatient Procedure:                Flexible Sigmoidoscopy Indications:              Fecal impaction Providers:                Shirley Friar, MD, Fransisca Connors, Rhodia Albright, Technician Referring MD:             hospital team Medicines:                Propofol per Anesthesia, Monitored Anesthesia Care Complications:            No immediate complications. Estimated Blood Loss:     Estimated blood loss was minimal. Procedure:                Pre-Anesthesia Assessment:                           - Prior to the procedure, a History and Physical                            was performed, and patient medications and                            allergies were reviewed. The patient's tolerance of                            previous anesthesia was also reviewed. The risks                            and benefits of the procedure and the sedation                            options and risks were discussed with the patient.                            All questions were answered, and informed consent                            was obtained. Prior Anticoagulants: The patient has                            taken no anticoagulant or antiplatelet agents. ASA                            Grade Assessment: III - A patient with severe                            systemic disease. After reviewing the risks and  benefits, the patient was deemed in satisfactory                            condition to undergo the procedure.                           After obtaining informed consent, the scope was                            passed under direct vision. The PCF-HQ190L                            ([id number]) Olympus colonoscope was introduced                             through the anus and advanced to the the left                            transverse colon. The flexible sigmoidoscopy was                            accomplished without difficulty. The patient                            tolerated the procedure well. The quality of the                            bowel preparation was fair. Scope In: 3:03:53 PM Scope Out: 3:11:08 PM Total Procedure Duration: 0 hours 7 minutes 15 seconds  Findings:      The perianal and digital rectal examinations were normal.      A diffuse area of severely congested, erythematous and ulcerated mucosa       was found in the mid rectum and in the distal rectum. Biopsies were       taken with a cold forceps for histology. Estimated blood loss was       minimal.      A small amount of liquid semi-liquid stool was found in the rectum, in       the recto-sigmoid colon and in the sigmoid colon, making visualization       difficult. Lavage of the area was performed, resulting in clearance with       good visualization.      Internal hemorrhoids were found during retroflexion. The hemorrhoids       were medium-sized and Grade I (internal hemorrhoids that do not       prolapse).      A few small-mouthed diverticula were found in the descending colon. Impression:               .                           - Preparation of the colon was fair.                           - Congested, erythematous and ulcerated mucosa in  the mid rectum and in the distal rectum. Biopsied.                           - Stool in the rectum, in the recto-sigmoid colon                            and in the sigmoid colon.                           - Internal hemorrhoids.                           - Diverticulosis in the descending colon. Moderate Sedation:      N/A - MAC procedure Recommendation:           - Use Canasa 1000 mg suppository 1 per rectum QHS.                           - Await pathology  results. Procedure Code(s):        --- Professional ---                           [phone], Sigmoidoscopy, flexible; with biopsy, single                            or multiple Diagnosis Code(s):        --- Professional ---                           K56.41, Fecal impaction                           K62.6, Ulcer of anus and rectum                           K62.89, Other specified diseases of anus and rectum                           K64.0, First degree hemorrhoids                           K57.30, Diverticulosis of large intestine without                            perforation or abscess without bleeding CPT copyright 2022 [employer]. All rights reserved. The codes documented in this report are preliminary and upon coder review may  be revised to meet current compliance requirements. Shirley Friar, MD 09/24/2023 3:25:37 PM This report has been signed electronically. Number of Addenda: 0

## 2023-09-24 NOTE — Consult Note (Signed)
Referring Provider: Dr. Renford Dills Primary Care Physician:  Camie Patience, FNP Primary Gastroenterologist:  Dr. Dulce Sellar  Reason for Consultation:  Fecal Impaction  HPI: Rachel Fuentes is a 63 y.o. female with onset of obstipation over the past week while on pain meds for her right foot. Unable to disimpact herself at home. Small stool particles and bleeding occurred with digital disimpaction at home. No significant results with NuLytely and with smog enemas and suppositories. Last colonoscopy in 2021 showed diverticulosis and a hyperplastic polyp. Fecal impaction seen on imaging. Due to severe rectal pain did not tolerate attempt at bedside disimpaction by nursing.  Past Medical History:  Diagnosis Date   Asthma     History reviewed. No pertinent surgical history.  Prior to Admission medications   Medication Sig Start Date End Date Taking? Authorizing Provider  albuterol (PROVENTIL) (2.5 MG/3ML) 0.083% nebulizer solution Take 3 mLs (2.5 mg total) by nebulization every 6 (six) hours as needed for wheezing or shortness of breath. 07/06/22 09/22/23 Yes Olalere, Adewale A, MD  albuterol (VENTOLIN HFA) 108 (90 Base) MCG/ACT inhaler INHALE 2 PUFFS INTO THE LUNGS EVERY 6 HOURS AS NEEDED FOR WHEEZING OR SHORTNESS OF BREATH 07/31/22  Yes Olalere, Adewale A, MD  aspirin EC 81 MG tablet Take 81 mg by mouth 2 (two) times daily.   Yes [provider]  B Complex Vitamins (B COMPLEX PO) Take 1 capsule by mouth daily.   Yes [provider]  buPROPion (WELLBUTRIN XL) 300 MG 24 hr tablet Take 300 mg by mouth daily. 02/18/18  Yes [provider]  CALCIUM PO Take 1 tablet by mouth daily.   Yes [provider]  diclofenac (VOLTAREN) 50 MG EC tablet Take 50 mg by mouth 2 (two) times daily. 01/05/18  Yes [provider]  FLUoxetine (PROZAC) 40 MG capsule Take 80 mg by mouth daily. 02/18/18  Yes [provider]  Fluticasone-Umeclidin-Vilant (TRELEGY ELLIPTA)  200-62.5-25 MCG/ACT AEPB Inhale 1 puff into the lungs daily. 03/16/23  Yes Glenford Bayley, NP  gabapentin (NEURONTIN) 400 MG capsule Take 800 mg by mouth at bedtime. 03/11/18  Yes [provider]  HYDROcodone-acetaminophen (NORCO/VICODIN) 5-325 MG tablet Take 1 tablet by mouth daily as needed for severe pain (pain score 7-10) or moderate pain (pain score 4-6).   Yes [provider]  ipratropium (ATROVENT) 0.06 % nasal spray Place 2 sprays into the nose daily. 08/19/23  Yes [provider]  levocetirizine (XYZAL) 5 MG tablet Take 5 mg by mouth daily. 02/28/18  Yes [provider]  levothyroxine (SYNTHROID, LEVOTHROID) 50 MCG tablet Take 50 mcg by mouth daily before breakfast. 02/18/18  Yes [provider]  methocarbamol (ROBAXIN) 500 MG tablet Take 500 mg by mouth daily as needed for muscle spasms.   Yes [provider]  mometasone (NASONEX) 50 MCG/ACT nasal spray Place 1 spray into the nose daily. 03/30/18  Yes [provider]  montelukast (SINGULAIR) 10 MG tablet Take 10 mg by mouth at bedtime. 03/18/18  Yes [provider]  Multiple Vitamins-Minerals (MULTIVITAMIN WITH MINERALS) tablet Take 1 tablet by mouth daily.   Yes [provider]  traZODone (DESYREL) 100 MG tablet Take 200 mg by mouth at bedtime.   Yes [provider]  valACYclovir (VALTREX) 1000 MG tablet Take 1,000 mg by mouth 3 (three) times daily as needed (flares). 01/15/18  Yes [provider]  valACYclovir (VALTREX) 500 MG tablet Take 500 mg by mouth daily.   Yes [provider]    Scheduled Meds:  enoxaparin (LOVENOX) injection  40 mg Subcutaneous Q24H   gabapentin  300 mg Oral TID   levothyroxine  50 mcg Oral QAC breakfast   polyethylene glycol  17 g Oral BID   senna-docusate  1 tablet Oral BID   Continuous Infusions: PRN Meds:.acetaminophen, lidocaine, ondansetron **OR** ondansetron (ZOFRAN) IV  Allergies as of 09/21/2023    (No Known Allergies)    Family History  Problem Relation Age of Onset   Breast cancer Neg Hx     Social History   Socioeconomic History   Marital status: Married    Spouse name: Not on file   Number of children: Not on file   Years of education: Not on file   Highest education level: Not on file  Occupational History   Not on file  Tobacco Use   Smoking status: Never   Smokeless tobacco: Never  Substance and Sexual Activity   Alcohol use: Not on file   Drug use: Not on file   Sexual activity: Not on file  Other Topics Concern   Not on file  Social History Narrative   Not on file   Social Drivers of Health   Financial Resource Strain: Not on file  Food Insecurity: No Food Insecurity (09/22/2023)   Hunger Vital Sign    Worried About Running Out of Food in the Last Year: Never true    Ran Out of Food in the Last Year: Never true  Transportation Needs: No Transportation Needs (09/22/2023)   PRAPARE - Administrator, Civil Service (Medical): No    Lack of Transportation (Non-Medical): No  Physical Activity: Not on file  Stress: Not on file  Social Connections: Not on file  Intimate Partner Violence: Not At Risk (09/22/2023)   Humiliation, Afraid, Rape, and Kick questionnaire    Fear of Current or Ex-Partner: No    Emotionally Abused: No    Physically Abused: No    Sexually Abused: No    Review of Systems: All negative except as stated above in HPI.  Physical Exam: Vital signs: Vitals:   09/23/23 2246 09/24/23 0658  BP: 129/83 138/85  Pulse: 67 67  Resp: 14 16  Temp: 98.3 F (36.8 C) 98.5 F (36.9 C)  SpO2: 96% 98%   Last BM Date : 09/23/23 General:   elderly, well-nourished, no acute distress, pleasant Head: normocephalic, atraumatic Eyes: anicteric sclera ENT: oropharynx clear Neck: supple, nontender Lungs:  Clear throughout to auscultation.   No wheezes, crackles, or rhonchi. No acute distress. Heart:  Regular rate and rhythm; no murmurs,  clicks, rubs,  or gallops. Abdomen: soft, nontender, nondistended, +BS  Rectal:  Deferred Ext: no edema  GI:  Lab Results: Recent Labs    09/22/23 0435 09/23/23 0509 09/24/23 0513  WBC 12.3* 8.3 7.0  HGB 12.1 10.5* 11.2*  HCT 36.2 31.9* 34.0*  PLT 250 215 241   BMET Recent Labs    09/21/23 2352 09/22/23 0435 09/24/23 0513  NA 138 134* 140  K 3.7 3.7 3.2*  CL 103 103 104  CO2 23 23 23   GLUCOSE 133* 134* 86  BUN 15 15 <5*  CREATININE 1.02* 0.78 0.76  CALCIUM 9.3 8.7* 9.1   LFT Recent Labs    09/22/23 0435  PROT 6.5  ALBUMIN 3.5  AST 23  ALT 18  ALKPHOS 56  BILITOT 1.1   PT/INR No results for input(s): "LABPROT", "INR" in the last 72 hours.   Studies/Results:  No results found.  Impression/Plan: Fecal impaction that has not improved with laxatives and enemas. Repeat Xray pending. Will do flexible sigmoidoscopy today with attempt at disimpaction and if that is not successful will need surgical disimpaction under anesthesia.    LOS: 1 day   Shirley Friar  09/24/2023, 12:39 PM  Questions please call [phone]

## 2023-09-25 DIAGNOSIS — K5900 Constipation, unspecified: Secondary | ICD-10-CM | POA: Diagnosis not present

## 2023-09-25 MED ORDER — POLYETHYLENE GLYCOL 3350 17 G PO PACK: 17.0000 g | PACK | Freq: Two times a day (BID) | ORAL | 0 refills | Status: AC

## 2023-09-25 MED ORDER — MESALAMINE 1000 MG RE SUPP: 1000.0000 mg | Freq: Every day | RECTAL | 0 refills | Status: AC

## 2023-09-25 MED ORDER — LIDOCAINE 5 % EX OINT: 1.0000 | TOPICAL_OINTMENT | Freq: Three times a day (TID) | CUTANEOUS | 0 refills | Status: AC | PRN

## 2023-09-25 MED ORDER — ACETAMINOPHEN 325 MG PO TABS: 650.0000 mg | ORAL_TABLET | Freq: Four times a day (QID) | ORAL | Status: AC | PRN

## 2023-09-25 MED ORDER — ONDANSETRON HCL 4 MG PO TABS: 4.0000 mg | ORAL_TABLET | Freq: Four times a day (QID) | ORAL | 0 refills | Status: AC | PRN

## 2023-09-25 MED ORDER — MESALAMINE 1000 MG RE SUPP
500.0000 mg | Freq: Two times a day (BID) | RECTAL | 0 refills | Status: DC
Start: 2023-09-25 — End: 2023-09-25

## 2023-09-25 NOTE — Discharge Summary (Signed)
 Physician Discharge Summary   Patient: Rachel Fuentes MRN: [id number]  DOB: [birth year]   Admit:     Date of Admission: 09/21/2023 Admitted from: home   Discharge: Date of discharge: 09/25/23 Disposition: Home Condition at discharge: good  CODE STATUS: FULL CODE     Discharge Physician: Sunnie Nielsen, DO [facility]     PCP: Camie Patience, FNP  Recommendations for Outpatient Follow-up:  Follow up with PCP Camie Patience, FNP in 2-4 weeks Follow up with Dr Dulce Sellar for GI recheck in 1-2 weeks, sooner as needed  Discharge Instructions     Diet - low sodium heart healthy   Complete by: As directed    Discharge instructions   Complete by: As directed    CONSTIPATION:  ALL THE TIME / DAILY:  Lifestyle measures Hydration: drink plenty of water Physical activity: at least walking daily High-fiber foods Bulk forming laxatives  Psyllium (Konsyl; Metamucil; Perdiem) Methylcellulose (Citrucel) Calcium polycarbophil (FiberCon; Fiber-Lax; Mitrolan) Wheat dextrin (Benefiber) Polyethylene glycol (MiraLAX, GlycoLax)  TAKE IN ADDITION TO THE ABOVE AS NEEDED FOR 3-5 DAYS AT A TIME -OR- CAN TAKE ROUTINELY EVERY FEW DAYS  Stimulant laxatives Senna (eg, Black Draught, Ex-Lax, Fletcher's, Castoria, Senokot)  Bisacodyl (eg, Correctol, Doxidan, Dulcolax). Taking stimulant laxatives regularly or in large amounts can cause side effects, including low potassium levels. Thus, you should take these drugs carefully if you must use them regularly.  PRESCRIPTIONS that treat severe constipation. They may be recommended if you do not respond to other treatments. Lubiprostone (Amitiza) Linaclotide (Linzess) Plecanatide (Trulance, Motegrity) Tenapanor Allena Napoleon)   Increase activity slowly   Complete by: As directed    No wound care   Complete by: As directed          Discharge Diagnoses: Principal Problem:   Obstipation Active Problems:   Moderate persistent  asthma without complication   Fibromyalgia   Gastroparesis   Fecal impaction [facility]       Hospital course / significant events:  Patient is a 63 year old female with history of asthma, hypothyroidism, depression, anxiety who presented with complaint of lack of bowel movement, nausea.  Abdominal imaging showed massive amount of stool throughout the colon.  Patient admitted for the management of severe constipation.  Tried aggressive bowel regimen, including smog enema.  But patient is still having discomfort, could not tolerate manual disimpaction, GI consulted, underwent flexible sigmoidoscopy 09/24/23. Pt reports improvement as of 02/15 and ready for discharge, per covering GI ok to follow up with Dr Dulce Sellar, send home w/ suppository as below, see med rec      Consultants:  Gastroenterology   Procedures/Surgeries: Flexible sigmoidoscopy w/ biopsies 09/24/2023      ASSESSMENT & PLAN:   Severe constipation - resolved S/p flex sigmoidoscopy Diverticulosis without diverticulitis  Erythematous and ulcerated colonic mucosa mid-rectum and distal rectum, biopsies pending   per GI - ok to continue canasa suppositories 100 mg at bedtime  Bowel regimen to prevent constipation   Moderate persistent asthma: Continue bronchodilators as needed.  Currently on room air.  Takes inhalers at home   Fibromyalgia: On chronic pain medications.  Most likely contributing to the constipation. Advised avoid opiates as able    Restless leg symptoms: On gabapentin   Hypothyroidism: Continue Synthyroid               Discharge Instructions  Allergies as of 09/25/2023       Reactions   American Cockroach Shortness Of Breath   Dust Mite  Extract Shortness Of Breath   Dog Epithelium (canis Lupus Familiaris) Other (See Comments)   Swelling of eyes, respiratory distress.    Cat Dander Other (See Comments)   Swelling of eyes, respiratory distress.         Medication List     STOP  taking these medications    methocarbamol 500 MG tablet Commonly known as: ROBAXIN       TAKE these medications    acetaminophen 325 MG tablet Commonly known as: TYLENOL Take 2 tablets (650 mg total) by mouth every 6 (six) hours as needed for headache, mild pain (pain score 1-3) or fever.   albuterol 108 (90 Base) MCG/ACT inhaler Commonly known as: VENTOLIN HFA INHALE 2 PUFFS INTO THE LUNGS EVERY 6 HOURS AS NEEDED FOR WHEEZING OR SHORTNESS OF BREATH What changed: Another medication with the same name was removed. Continue taking this medication, and follow the directions you see here.   aspirin EC 81 MG tablet Take 81 mg by mouth 2 (two) times daily.   B COMPLEX PO Take 1 capsule by mouth daily.   buPROPion 300 MG 24 hr tablet Commonly known as: WELLBUTRIN XL Take 300 mg by mouth daily.   CALCIUM PO Take 1 tablet by mouth daily.   diclofenac 50 MG EC tablet Commonly known as: VOLTAREN Take 50 mg by mouth 2 (two) times daily.   FLUoxetine 40 MG capsule Commonly known as: PROZAC Take 80 mg by mouth daily.   gabapentin 400 MG capsule Commonly known as: NEURONTIN Take 800 mg by mouth at bedtime.   HYDROcodone-acetaminophen 5-325 MG tablet Commonly known as: NORCO/VICODIN Take 1 tablet by mouth daily as needed for severe pain (pain score 7-10) or moderate pain (pain score 4-6).   ipratropium 0.06 % nasal spray Commonly known as: ATROVENT Place 2 sprays into the nose daily.   levocetirizine 5 MG tablet Commonly known as: XYZAL Take 5 mg by mouth daily.   levothyroxine 50 MCG tablet Commonly known as: SYNTHROID Take 50 mcg by mouth daily before breakfast.   lidocaine 5 % ointment Commonly known as: XYLOCAINE Apply 1 Application topically 3 (three) times daily as needed (externally only for anal pain). 2 hours before skin procedure.   mesalamine 1000 MG suppository Commonly known as: CANASA Place 1 suppository (1,000 mg total) rectally at bedtime for 14  days.   mometasone 50 MCG/ACT nasal spray Commonly known as: NASONEX Place 1 spray into the nose daily.   montelukast 10 MG tablet Commonly known as: SINGULAIR Take 10 mg by mouth at bedtime.   multivitamin with minerals tablet Take 1 tablet by mouth daily.   ondansetron 4 MG tablet Commonly known as: ZOFRAN Take 1 tablet (4 mg total) by mouth every 6 (six) hours as needed for nausea.   polyethylene glycol 17 g packet Commonly known as: MIRALAX / GLYCOLAX Take 17 g by mouth 2 (two) times daily for 14 days.   traZODone 100 MG tablet Commonly known as: DESYREL Take 200 mg by mouth at bedtime.   Trelegy Ellipta 200-62.5-25 MCG/ACT Aepb Generic drug: Fluticasone-Umeclidin-Vilant Inhale 1 puff into the lungs daily.   valACYclovir 500 MG tablet Commonly known as: VALTREX Take 500 mg by mouth daily.   valACYclovir 1000 MG tablet Commonly known as: VALTREX Take 1,000 mg by mouth 3 (three) times daily as needed (flares).         Follow-up Information     Willis Modena, MD. Schedule an appointment as soon as possible for a  visit.   Specialty: Gastroenterology Why: call their office to schedule hospital follow up in about 2 weeks / check up sooner if needed if symptoms worsen Contact information: [address] [place] [address] [phone]                 Allergies  Allergen Reactions   American Cockroach Shortness Of Breath   Dust Mite Extract Shortness Of Breath   Dog Epithelium (Canis Lupus Familiaris) Other (See Comments)    Swelling of eyes, respiratory distress.    Cat Dander Other (See Comments)    Swelling of eyes, respiratory distress.      Subjective: pt repots feeling better, tolerating diet, no abd pain, passing some stool again, no concerns and she is requesting for discharge home asap    Discharge Exam: BP 132/71 (BP Location: Left Arm)   Pulse 63   Temp 97.9 F (36.6 C) (Oral)   Resp 15   Ht 5\' 11"  (1.803 m)    Wt 76.2 kg   SpO2 95%   BMI 23.43 kg/m  General: Pt is alert, awake, not in acute distress Cardiovascular: RRR, S1/S2 +, no rubs, no gallops Respiratory: CTA bilaterally, no wheezing, no rhonchi Abdominal: Soft, NT, ND, bowel sounds + Extremities: no edema, no cyanosis     The results of significant diagnostics from this hospitalization (including imaging, microbiology, ancillary and laboratory) are listed below for reference.     Microbiology: No results found for this or any previous visit (from the past 240 hours).   Labs: BNP (last 3 results) No results for input(s): "BNP" in the last 8760 hours. Basic Metabolic Panel: Recent Labs  Lab 09/21/23 2352 09/22/23 0435 09/24/23 0513  NA 138 134* 140  K 3.7 3.7 3.2*  CL 103 103 104  CO2 23 23 23   GLUCOSE 133* 134* 86  BUN 15 15 <5*  CREATININE 1.02* 0.78 0.76  CALCIUM 9.3 8.7* 9.1   Liver Function Tests: Recent Labs  Lab 09/22/23 0435  AST 23  ALT 18  ALKPHOS 56  BILITOT 1.1  PROT 6.5  ALBUMIN 3.5   No results for input(s): "LIPASE", "AMYLASE" in the last 168 hours. No results for input(s): "AMMONIA" in the last 168 hours. CBC: Recent Labs  Lab 09/21/23 2352 09/22/23 0435 09/23/23 0509 09/24/23 0513  WBC 10.0 12.3* 8.3 7.0  NEUTROABS 8.3*  --   --   --   HGB 12.6 12.1 10.5* 11.2*  HCT 37.4 36.2 31.9* 34.0*  MCV 88.6 89.2 91.1 88.1  PLT 242 250 215 241   Cardiac Enzymes: No results for input(s): "CKTOTAL", "CKMB", "CKMBINDEX", "TROPONINI" in the last 168 hours. BNP: Invalid input(s): "POCBNP" CBG: No results for input(s): "GLUCAP" in the last 168 hours. D-Dimer No results for input(s): "DDIMER" in the last 72 hours. Hgb A1c No results for input(s): "HGBA1C" in the last 72 hours. Lipid Profile No results for input(s): "CHOL", "HDL", "LDLCALC", "TRIG", "CHOLHDL", "LDLDIRECT" in the last 72 hours. Thyroid function studies No results for input(s): "TSH", "T4TOTAL", "T3FREE", "THYROIDAB" in the last  72 hours.  Invalid input(s): "FREET3" Anemia work up No results for input(s): "VITAMINB12", "FOLATE", "FERRITIN", "TIBC", "IRON", "RETICCTPCT" in the last 72 hours. Urinalysis No results found for: "COLORURINE", "APPEARANCEUR", "LABSPEC", "PHURINE", "GLUCOSEU", "HGBUR", "BILIRUBINUR", "KETONESUR", "PROTEINUR", "UROBILINOGEN", "NITRITE", "LEUKOCYTESUR" Sepsis Labs Recent Labs  Lab 09/21/23 2352 09/22/23 0435 09/23/23 0509 09/24/23 0513  WBC 10.0 12.3* 8.3 7.0   Microbiology No results found for this or any  previous visit (from the past 240 hours). Imaging DG Abdomen 1 View Result Date: 09/21/2023 CLINICAL DATA:  Fecal impaction, no bowel movement for 3 days, lower abdominal pain EXAM: ABDOMEN - 1 VIEW COMPARISON:  None Available. FINDINGS: Three supine frontal views of the abdomen and pelvis are obtained. No bowel obstruction or ileus. There is a large amount of retained stool within the rectal vault compatible with fecal impaction. No masses or abnormal calcifications. Lung bases are clear. No acute bony abnormalities. IMPRESSION: 1. Large amount of retained stool in the rectal vault consistent with fecal impaction. No obstruction or ileus. Electronically Signed   By: Sharlet Salina M.D.   On: 09/21/2023 20:47      Time coordinating discharge: over 30 minutes  SIGNED:  Sunnie Nielsen DO [facility]

## 2023-09-25 NOTE — Plan of Care (Signed)
   Problem: Clinical Measurements: Goal: Respiratory complications will improve Outcome: Progressing

## 2023-09-27 ENCOUNTER — Encounter (HOSPITAL_COMMUNITY): Payer: Self-pay | Admitting: Gastroenterology

## 2023-09-28 LAB — SURGICAL PATHOLOGY

## 2024-01-06 ENCOUNTER — Other Ambulatory Visit: Payer: Self-pay | Admitting: Nurse Practitioner

## 2024-01-06 ENCOUNTER — Ambulatory Visit
Admission: RE | Admit: 2024-01-06 | Discharge: 2024-01-06 | Disposition: A | Discharge status: Home / Self Care | Source: Ambulatory Visit | Attending: Nurse Practitioner | Admitting: Nurse Practitioner

## 2024-01-06 DIAGNOSIS — R109 Unspecified abdominal pain: Secondary | ICD-10-CM

## 2024-02-25 ENCOUNTER — Other Ambulatory Visit: Payer: Self-pay | Admitting: Primary Care

## 2024-02-25 NOTE — Telephone Encounter (Signed)
 Copied from CRM [id number]. Topic: Clinical - Medication Refill >> Feb 25, 2024 12:59 PM Russell PARAS wrote: Medication: Fluticasone-Umeclidin-Vilant (TRELEGY ELLIPTA ) 200-62.5-25 MCG/ACT AEPB   Has the patient contacted their pharmacy? Yes (Agent: If no, request that the patient contact the pharmacy for the refill. If patient does not wish to contact the pharmacy document the reason why and proceed with request.) (Agent: If yes, when and what did the pharmacy advise?)  This is the patient's preferred pharmacy:  [id number] [place], [place] - [address] AT [facility] [address] [facility] [address] & [facility] [address] [place] [address] Phone: [phone] Fax: [phone]  Is this the correct pharmacy for this prescription? Yes If no, delete pharmacy and type the correct one.   Has the prescription been filled recently? Yes  Is the patient out of the medication? No  Has the patient been seen for an appointment in the last year OR does the patient have an upcoming appointment? Yes  Can we respond through MyChart? Yes  Agent: Please be advised that Rx refills may take up to 3 business days. We ask that you follow-up with your pharmacy.

## 2024-02-28 DIAGNOSIS — M1990 Unspecified osteoarthritis, unspecified site: Secondary | ICD-10-CM | POA: Insufficient documentation

## 2024-02-28 DIAGNOSIS — E785 Hyperlipidemia, unspecified: Secondary | ICD-10-CM | POA: Insufficient documentation

## 2024-02-28 DIAGNOSIS — E039 Hypothyroidism, unspecified: Secondary | ICD-10-CM | POA: Insufficient documentation

## 2024-02-28 DIAGNOSIS — I77819 Aortic ectasia, unspecified site: Secondary | ICD-10-CM | POA: Insufficient documentation

## 2024-02-28 DIAGNOSIS — K219 Gastro-esophageal reflux disease without esophagitis: Secondary | ICD-10-CM | POA: Insufficient documentation

## 2024-02-28 DIAGNOSIS — N952 Postmenopausal atrophic vaginitis: Secondary | ICD-10-CM | POA: Insufficient documentation

## 2024-02-28 DIAGNOSIS — Z860100 Personal history of colon polyps, unspecified: Secondary | ICD-10-CM | POA: Insufficient documentation

## 2024-02-28 DIAGNOSIS — R739 Hyperglycemia, unspecified: Secondary | ICD-10-CM | POA: Insufficient documentation

## 2024-02-28 DIAGNOSIS — M00869 Arthritis due to other bacteria, unspecified knee: Secondary | ICD-10-CM | POA: Insufficient documentation

## 2024-02-28 DIAGNOSIS — E2839 Other primary ovarian failure: Secondary | ICD-10-CM | POA: Insufficient documentation

## 2024-02-28 DIAGNOSIS — F331 Major depressive disorder, recurrent, moderate: Secondary | ICD-10-CM | POA: Insufficient documentation

## 2024-02-28 DIAGNOSIS — G9332 Myalgic encephalomyelitis/chronic fatigue syndrome: Secondary | ICD-10-CM | POA: Insufficient documentation

## 2024-02-28 MED ORDER — TRELEGY ELLIPTA 200-62.5-25 MCG/ACT IN AEPB: 1.0000 | INHALATION_SPRAY | Freq: Every day | RESPIRATORY_TRACT | 1 refills | Status: DC

## 2024-04-21 ENCOUNTER — Encounter: Payer: Self-pay | Admitting: Cardiology

## 2024-05-07 ENCOUNTER — Other Ambulatory Visit: Payer: Self-pay | Admitting: Primary Care

## 2024-05-09 ENCOUNTER — Other Ambulatory Visit: Payer: Self-pay | Admitting: Primary Care

## 2024-05-09 NOTE — Telephone Encounter (Unsigned)
 Copied from CRM [id number]. Topic: Clinical - Medication Refill >> May 09, 2024  3:21 PM Leila C wrote: Most Recent Pulmonary Care Visit:  Provider: NP, Almarie Ferrari Department: [facility] Date: 10/07/22  Medication(s): Fluticasone-Umeclidin-Vilant (TRELEGY ELLIPTA ) 200-62.5-25 MCG/ACT AEPB  Has the patient contacted their pharmacy? Yes, pharmacy sent a request and have not heard from the office.  (Agent: If no, request that the patient contact the pharmacy for the refill. If patient does not wish to contact the pharmacy document the reason why and proceed with request.) (Agent: If yes, when and what did the pharmacy advise?)  This is the patient's preferred pharmacy:  [id number] [place], [place] - [address] AT [facility] OF [facility] [address] & [facility] [address] [place] [address] Phone: [phone] Fax: [phone]  Is this the correct pharmacy for this prescription? Yes If no, delete pharmacy and type the correct one.   Has the prescription been filled recently? No  Is the patient out of the medication? No, will be out of medication in 2 days and is requesting for refill to be done today please. Patient cannot be out of medication.   Has the patient been seen for an appointment in the last year OR does the patient have an upcoming appointment? Patient states does not need to be seen, her allergist will prescribe medication and advised patient to contact prescribing provider.   Can we respond through MyChart? No, please call [phone]  Agent: Please be advised that Rx refills may take up to 3 business days. We ask that you follow-up with your pharmacy.

## 2024-05-15 ENCOUNTER — Encounter: Payer: Self-pay | Admitting: Primary Care

## 2024-05-15 ENCOUNTER — Ambulatory Visit: Payer: Self-pay | Admitting: Primary Care

## 2024-05-15 ENCOUNTER — Ambulatory Visit (INDEPENDENT_AMBULATORY_CARE_PROVIDER_SITE_OTHER): Admitting: Primary Care

## 2024-05-15 VITALS — BP 124/70 | HR 75 | Temp 97.5°F | Ht 71.0 in | Wt 175.6 lb

## 2024-05-15 DIAGNOSIS — J4541 Moderate persistent asthma with (acute) exacerbation: Secondary | ICD-10-CM

## 2024-05-15 DIAGNOSIS — Z23 Encounter for immunization: Secondary | ICD-10-CM | POA: Diagnosis not present

## 2024-05-15 DIAGNOSIS — Z0000 Encounter for general adult medical examination without abnormal findings: Secondary | ICD-10-CM

## 2024-05-15 LAB — POCT EXHALED NITRIC OXIDE: FeNO level (ppb): 31

## 2024-05-15 MED ORDER — MONTELUKAST SODIUM 10 MG PO TABS: 10.0000 mg | ORAL_TABLET | Freq: Every day | ORAL | 11 refills | Status: AC

## 2024-05-15 MED ORDER — TRELEGY ELLIPTA 200-62.5-25 MCG/ACT IN AEPB: 1.0000 | INHALATION_SPRAY | Freq: Every day | RESPIRATORY_TRACT | 11 refills | Status: AC

## 2024-05-15 MED ORDER — LEVOCETIRIZINE DIHYDROCHLORIDE 5 MG PO TABS: 5.0000 mg | ORAL_TABLET | Freq: Every day | ORAL | 11 refills | Status: AC

## 2024-05-15 MED ORDER — PREDNISONE 10 MG PO TABS: ORAL_TABLET | ORAL | 0 refills | Status: AC

## 2024-05-15 MED ORDER — ALBUTEROL SULFATE HFA 108 (90 BASE) MCG/ACT IN AERS: 2.0000 | INHALATION_SPRAY | Freq: Four times a day (QID) | RESPIRATORY_TRACT | 3 refills | Status: AC | PRN

## 2024-05-15 MED ORDER — IPRATROPIUM BROMIDE 0.06 % NA SOLN: 2.0000 | Freq: Every day | NASAL | 5 refills | Status: AC

## 2024-05-15 MED ORDER — MOMETASONE FUROATE 50 MCG/ACT NA SUSP: 1.0000 | Freq: Every day | NASAL | 5 refills | Status: AC

## 2024-05-15 NOTE — Patient Instructions (Signed)
  VISIT SUMMARY: Today, you came in for a follow-up visit regarding your asthma. You have been experiencing a flare-up for about a month, with increased shortness of breath and a deep cough. We discussed your current treatment regimen and potential new treatments to better manage your symptoms.  YOUR PLAN: -MODERATE PERSISTENT ASTHMA WITH RECURRENT EXACERBATIONS: Moderate persistent asthma means you have ongoing asthma symptoms that can flare up, especially in spring and fall. We will start you on a higher dose of prednisone  (60 mg) to manage your current flare-up and taper it as needed. We also discussed the possibility of adding biologic therapy, such as Xolair or Dupixent, which can help block the allergy response at a cellular level. We will check your allergy markers and IgE levels with blood work and perform a FENO test to assess airway inflammation. Your current medications, including Trelegy, Singulair, albuterol , and nasal sprays, will be refilled. We will also discuss insurance coverage for biologics with your pharmacist.  -ALLERGIC RHINITIS WITH PERENNIAL AND ANIMAL DANDER SENSITIZATION: Allergic rhinitis means you have ongoing nasal allergy symptoms, which are worsened by allergens like animal dander, dust mites, and roaches. You will continue with your allergy shots and current antihistamines. We discussed the potential for biologic therapy if your symptoms do not improve. You may also add Pepcid at bedtime to help with allergy symptoms and consider using Benadryl if needed.  INSTRUCTIONS: Please follow up with blood work to check your allergy markers and IgE levels, and perform the FENO test to assess airway inflammation. Continue taking your medications as prescribed and start the higher dose of prednisone  (60 mg), tapering as needed. Discuss with your pharmacist about insurance coverage for biologic therapy. Follow up with your allergist to discuss the potential for biologic therapy if your  symptoms do not improve.  Orders: FENO  Follow-up 3 months with Beth NP or sooner if needed

## 2024-05-15 NOTE — Progress Notes (Signed)
 @Patient  ID: Rachel Fuentes, female    DOB: 07-19-1961, 63 y.o.   MRN: [id number]  Chief Complaint  Patient presents with   Asthma    Referring provider: Dyane Anthony RAMAN, FNP  HPI: 63 year old female, never smoked.  Past medical history significant for moderate persistent asthma, LPR.  Patient of Dr. Neda.  Previous [facility] pulmonary encounter: 07/07/23- Dr. Neda  Symptoms are a bit better Continues to use Trelegy  Uses albuterol  as needed  Multiple allergens to cats and dogs -No change in her immediate environment  Previously was taking allergy shots  Asthma is for most of her adult life  She does have a history of reflux-on famotidine History of gastroparesis, fibromyalgia, osteoarthritis, anxiety and depression  Occupational history-office work  Any kind of activity recently contributes to shortness of breath  Pets: Dogs and cats  Occupation: Office work, grocery  Exposures: New flooring did make symptoms worse  Smoking history: Never smoker, was exposed to a lot of secondhand smoke growing up  10/07/2022 Patient presents today for follow-up asthma  Hx asthma for most of her life. She has multiple allergens to cats/dogs, dust mites. IgE 123/ Eos absolute 100 in Feb 2023 Maintained on Trelegy one puff daily She is doing well today without acute asthma symptoms She has chronic dyspnea, reports difficulty taking a deep breath.  Echocardiogram in April 2023 showed normal EF with mild diastolic dysfunction  No overt chest tightness or wheezing No recent asthma exacerbations requiring oral prednisone  Spirometry today was normal without obstruction. She did not have full pulmonary function testing as PFTs were only scheduled for 30 mins. Patient did not want to reschedule visit.    05/15/2024- interim hx  Discussed the use of AI scribe software for clinical note transcription with the patient, who gave verbal consent to proceed.  History of Present  Illness Rachel Fuentes is a 63 year old female with asthma who presents for a follow-up visit.  Hx asthma for most of her life. She has multiple allergens to cats/dogs, dust mites. IgE 123/ Eos absolute 100 in Feb 2023  She is experiencing a flare-up of her asthma, which typically occurs twice a year in the spring and fall. During these episodes, she experiences increased shortness of breath and a 'croaky kind of cough,' significantly impacting her energy levels. These symptoms have persisted for about a month.  Her current medication regimen includes Trelegy 200 mcg, Singulair, mometasone nasal spray, and Xyzal. She uses her rescue inhaler more frequently during flare-ups, and albuterol  provides relief. She is also on allergy shots under the care of Dr. Frutoso at [facility], which she has been receiving for over five years. Despite this, allergy testing shows significant allergies to cats, dogs, roaches, and dust mites, all present in her home.  She has a history of moderate persistent asthma and laryngeal reflux. She has never smoked. She has multiple allergies, including to cats and dogs, and continues to receive allergy shots. She reports minimal improvement during flare-ups despite her current treatment regimen.  She mentions having had difficulty with off-gassing from luxury vinyl tile installed in her home a few years ago, which initially led her to seek pulmonary care. Her regular allergist has deferred prescribing Trelegy, requiring her to obtain it through her pulmonologist, leading to additional copays.  Lower doses of prednisone  are ineffective, while higher doses cause a rash, but she finds relief with higher doses despite the side effects.    Pulmonary testing: IgE 123/  Eos absolute 100 in Feb 2023  10/07/2022 FVC 4.16 (100%), FEV1 3.31 (103%), ratio 80, DLCOcor 24.69 (98%)  Allergies  Allergen Reactions   American Cockroach Shortness Of Breath   Dust Mite Extract  Shortness Of Breath   Dog Epithelium (Canis Lupus Familiaris) Other (See Comments)    Swelling of eyes, respiratory distress.    Cat Dander Other (See Comments)    Swelling of eyes, respiratory distress.     Immunization History  Administered Date(s) Administered   INFLUENZA, HIGH DOSE SEASONAL PF 10/10/2019, 10/09/2020   Influenza Whole 05/16/2020   Influenza, Quadrivalent, Recombinant, Inj, Pf 05/06/2019   Influenza, Seasonal, Injecte, Preservative Fre 05/15/2024   PFIZER(Purple Top)SARS-COV-2 Vaccination 10/09/2020   Pneumococcal Polysaccharide-23 10/10/2019, 10/09/2020    Past Medical History:  Diagnosis Date   Asthma    Fibromyalgia     Tobacco History: Social History   Tobacco Use  Smoking Status Never  Smokeless Tobacco Never   Counseling given: Not Answered   Outpatient Medications Prior to Visit  Medication Sig Dispense Refill   acetaminophen  (TYLENOL ) 325 MG tablet Take 2 tablets (650 mg total) by mouth every 6 (six) hours as needed for headache, mild pain (pain score 1-3) or fever.     B Complex Vitamins (B COMPLEX PO) Take 1 capsule by mouth daily.     buPROPion (WELLBUTRIN XL) 300 MG 24 hr tablet Take 300 mg by mouth daily.  3   CALCIUM PO Take 1 tablet by mouth daily.     diclofenac (VOLTAREN) 50 MG EC tablet Take 50 mg by mouth 2 (two) times daily.  3   EPINEPHrine 0.3 mg/0.3 mL IJ SOAJ injection Inject 0.3 mg into the muscle as needed.     gabapentin  (NEURONTIN ) 400 MG capsule Take 800 mg by mouth at bedtime.  2   HYDROcodone-acetaminophen  (NORCO/VICODIN) 5-325 MG tablet Take 1 tablet by mouth daily as needed for severe pain (pain score 7-10) or moderate pain (pain score 4-6).     levothyroxine  (SYNTHROID , LEVOTHROID) 50 MCG tablet Take 50 mcg by mouth daily before breakfast.  2   metroNIDAZOLE (METROGEL) 0.75 % vaginal gel 1 Application.     Multiple Vitamins-Minerals (MULTIVITAMIN WITH MINERALS) tablet Take 1 tablet by mouth daily.     traZODone   (DESYREL ) 100 MG tablet Take 200 mg by mouth at bedtime.     valACYclovir (VALTREX) 500 MG tablet Take 500 mg by mouth daily.     venlafaxine XR (EFFEXOR-XR) 37.5 MG 24 hr capsule Take 37.5 mg by mouth daily with breakfast.     albuterol  (VENTOLIN  HFA) 108 (90 Base) MCG/ACT inhaler INHALE 2 PUFFS INTO THE LUNGS EVERY 6 HOURS AS NEEDED FOR WHEEZING OR SHORTNESS OF BREATH 6.7 g 3   Fluticasone-Umeclidin-Vilant (TRELEGY ELLIPTA ) 200-62.5-25 MCG/ACT AEPB Inhale 1 puff into the lungs daily. NEEDS APPT FOR FURTHER REFILLS 28 each 1   ipratropium (ATROVENT) 0.06 % nasal spray Place 2 sprays into the nose daily.     levocetirizine (XYZAL) 5 MG tablet Take 5 mg by mouth daily.  0   mometasone (NASONEX) 50 MCG/ACT nasal spray Place 1 spray into the nose daily.     montelukast (SINGULAIR) 10 MG tablet Take 10 mg by mouth at bedtime.  0   aspirin EC 81 MG tablet Take 81 mg by mouth 2 (two) times daily.     FLUoxetine (PROZAC) 20 MG capsule Take 20 mg by mouth daily.     FLUoxetine (PROZAC) 40 MG capsule Take 80 mg  by mouth daily.  3   lidocaine  (XYLOCAINE ) 5 % ointment Apply 1 Application topically 3 (three) times daily as needed (externally only for anal pain). 2 hours before skin procedure. 50 g 0   mesalamine  (CANASA ) 1000 MG suppository Place 1 suppository (1,000 mg total) rectally at bedtime for 14 days. 14 suppository 0   ondansetron  (ZOFRAN ) 4 MG tablet Take 1 tablet (4 mg total) by mouth every 6 (six) hours as needed for nausea. 20 tablet 0   No facility-administered medications prior to visit.   Review of Systems  Review of Systems  Constitutional: Negative.   HENT: Negative.    Respiratory:  Positive for cough and shortness of breath.   Cardiovascular: Negative.      Physical Exam  BP 124/70   Pulse 75   Temp (!) 97.5 F (36.4 C)   Ht 5' 11 (1.803 m)   Wt 175 lb 9.6 oz (79.7 kg)   SpO2 94% Comment: RA  BMI 24.49 kg/m  Physical Exam Constitutional:      Appearance: Normal  appearance. She is well-developed.  HENT:     Head: Normocephalic and atraumatic.     Mouth/Throat:     Mouth: Mucous membranes are moist.     Pharynx: Oropharynx is clear.  Eyes:     Pupils: Pupils are equal, round, and reactive to light.  Cardiovascular:     Rate and Rhythm: Normal rate and regular rhythm.     Heart sounds: Normal heart sounds. No murmur heard. Pulmonary:     Effort: Pulmonary effort is normal. No respiratory distress.     Breath sounds: Normal breath sounds. No wheezing or rhonchi.  Abdominal:     Tenderness: There is no abdominal tenderness.  Musculoskeletal:        General: Normal range of motion.     Cervical back: Normal range of motion and neck supple.  Skin:    General: Skin is warm and dry.     Findings: No erythema or rash.  Neurological:     General: No focal deficit present.     Mental Status: She is alert and oriented to person, place, and time. Mental status is at baseline.  Psychiatric:        Mood and Affect: Mood normal.        Behavior: Behavior normal.        Thought Content: Thought content normal.        Judgment: Judgment normal.     Lab Results:  CBC    Component Value Date/Time   WBC 7.0 09/24/2023 0513   RBC 3.86 (L) 09/24/2023 0513   HGB 11.2 (L) 09/24/2023 0513   HCT 34.0 (L) 09/24/2023 0513   PLT 241 09/24/2023 0513   MCV 88.1 09/24/2023 0513   MCH 29.0 09/24/2023 0513   MCHC 32.9 09/24/2023 0513   RDW 12.2 09/24/2023 0513   LYMPHSABS 0.8 09/21/2023 2352   MONOABS 0.8 09/21/2023 2352   EOSABS 0.0 09/21/2023 2352   BASOSABS 0.0 09/21/2023 2352    BMET    Component Value Date/Time   NA 140 09/24/2023 0513   K 3.2 (L) 09/24/2023 0513   CL 104 09/24/2023 0513   CO2 23 09/24/2023 0513   GLUCOSE 86 09/24/2023 0513   BUN <5 (L) 09/24/2023 0513   CREATININE 0.76 09/24/2023 0513   CALCIUM 9.1 09/24/2023 0513   GFRNONAA >60 09/24/2023 0513   GFRAA >60 05/22/2017 2024    BNP No results found for: BNP  ProBNP     Component Value Date/Time   PROBNP 25.0 09/25/2021 1110    Imaging: No results found.   Assessment & Plan:   1. Moderate persistent asthma with exacerbation (Primary) - POCT EXHALED NITRIC OXIDE  - CBC w/Diff; Future - IgE; Future - IgE - CBC w/Diff  2. Routine health maintenance - Flu vaccine trivalent PF, 6mos and older(Flulaval,Afluria,Fluarix,Fluzone)  Assessment and Plan Assessment & Plan Moderate persistent asthma with recurrent exacerbations Moderate persistent asthma with recurrent exacerbations, particularly in spring and fall, likely due to environmental allergens such as mold and pets. Currently experiencing a flare-up with symptoms of shortness of breath and a deep cough, persisting for about a month. Lung function tests were normal. On maximal therapy with Trelegy 200, Singulair, and nasal sprays. Considering biologic therapy due to recurrent exacerbations and minimal improvement with allergy shots. Discussed potential use of biologics like Xolair or Dupixent, which are add-on treatments for asthma. Discussed prednisone  as a short-term treatment to manage current flare-up, with a preference for higher doses due to better response in the past. FENO was slightly elevated today at 40 - Order blood work to check allergy markers and IgE levels. - Perform  a feno test to assess airway inflammation. - Prescribe prednisone  at a higher dose, starting at 60 mg, and taper as needed. - Refill Trelegy, Singulair, albuterol , and nasal sprays. - Consider biologic therapy if blood work indicates candidacy.  Allergic rhinitis Allergic rhinitis with perennial and animal dander sensitization, with known allergies to cats, dogs, roaches, and dust mites. Currently on allergy shots for several years with minimal improvement in allergy testing. Lives with multiple animals, which may contribute to symptoms. Discussed potential for biologic therapy as current allergy shots are not providing  significant relief. - Continue allergy shots. - Discuss potential for biologic therapy with allergist. - Consider adding Pepcid at bedtime to help with allergy symptoms. - Continue current antihistamines and consider adding Benadryl if needed.  Almarie LELON Ferrari, NP 05/15/2024

## 2024-05-16 LAB — CBC WITH DIFFERENTIAL/PLATELET
Absolute Lymphocytes: 2296 {cells}/uL (ref 850–3900)
Absolute Monocytes: 627 {cells}/uL (ref 200–950)
Basophils Absolute: 62 {cells}/uL (ref 0–200)
Basophils Relative: 1.1 %
Eosinophils Absolute: 347 {cells}/uL (ref 15–500)
Eosinophils Relative: 6.2 %
HCT: 37.6 % (ref 35.0–45.0)
Hemoglobin: 12.5 g/dL (ref 11.7–15.5)
MCH: 29.8 pg (ref 27.0–33.0)
MCHC: 33.2 g/dL (ref 32.0–36.0)
MCV: 89.7 fL (ref 80.0–100.0)
MPV: 10.7 fL (ref 7.5–12.5)
Monocytes Relative: 11.2 %
Neutro Abs: 2268 {cells}/uL (ref 1500–7800)
Neutrophils Relative %: 40.5 %
Platelets: 234 Thousand/uL (ref 140–400)
RBC: 4.19 Million/uL (ref 3.80–5.10)
RDW: 12.7 % (ref 11.0–15.0)
Total Lymphocyte: 41 %
WBC: 5.6 Thousand/uL (ref 3.8–10.8)

## 2024-05-16 LAB — IGE: IgE (Immunoglobulin E), Serum: 98 kU/L (ref <=114)

## 2024-05-16 NOTE — Telephone Encounter (Signed)
 Copied from CRM [id number]. Topic: Appointments - Appointment Scheduling >> May 12, 2024  2:46 PM Rilla B wrote: Patient out of Trelegly medication. Refill placed earlier today.  Has appt for Monday, 10/06. Patient is concerned about stopping medication. Please call patient @ [phone]  Pt had appt 10/6. Nothing further needed

## 2024-05-29 NOTE — Telephone Encounter (Addendum)
-----   Message from Almarie LELON Ferrari sent at 05/22/2024 11:42 AM EDT ----- Allergy testings was elevated, Eosinophils were 347 and IgE 98. She would qualify for add on treatment for asthma with biologic therapy if she would like to consider this. Or we can see how she does  with the prednisone , if flare ups occurring > 2 times a year or she is hospitalized for her asthma would recommend treatment   Called; Left a message to call back x2

## 2024-05-29 NOTE — Telephone Encounter (Signed)
 Copied from CRM #[id number]. Topic: Clinical - Lab/Test Results >> May 29, 2024 10:04 AM Rozanna MATSU wrote: Reason for CRM: pt returning call to clinic about Allergy test. Pt stated she will be in and out this morning.

## 2024-06-27 NOTE — Progress Notes (Signed)
 ATC X1. LMTCB. I will send pt a mychart message to inform then completing note per protocol. NFN

## 2024-06-30 ENCOUNTER — Other Ambulatory Visit: Payer: Self-pay | Admitting: Family Medicine

## 2024-06-30 ENCOUNTER — Telehealth: Payer: Self-pay

## 2024-06-30 DIAGNOSIS — Z1231 Encounter for screening mammogram for malignant neoplasm of breast: Secondary | ICD-10-CM

## 2024-06-30 NOTE — Telephone Encounter (Signed)
 Copied from CRM [id number]. Topic: Clinical - Lab/Test Results >> Jun 30, 2024 12:27 PM Leila BROCKS wrote: Reason for CRM: Patient [phone] is returning the office call regarding, a message was not left but no details was left. Per [facility], there's no triage today send a crm. Please advise and call back.  ATCx1 LVMTCB

## 2024-07-03 NOTE — Telephone Encounter (Signed)
 Copied from CRM [id number]. Topic: General - Other >> Jun 30, 2024  2:30 PM Devaughn RAMAN wrote: Reason for CRM: Pt returning [facility] phone call, contacted [facility] no answer/ Please f/u with pt   ATC x2.  LMTCB & advised pt to check Mychart as we sent these results in 10/6 encounter.

## 2024-07-04 NOTE — Telephone Encounter (Signed)
 Pt has been ATCx2 and unable to be reached. Sending MyChart message, per protocol. NFN

## 2024-07-14 ENCOUNTER — Ambulatory Visit: Payer: Self-pay | Admitting: Primary Care

## 2024-07-14 NOTE — Telephone Encounter (Signed)
 FYI Only or Action Required?: Action required by provider: clinical question for provider and update on patient condition.  Patient is followed in Pulmonology for SOB last seen on 05/15/2024 by Hope Almarie ORN, NP.  Called Nurse Triage reporting Advice Only.  Symptoms began several days ago.  Interventions attempted: Prescription medications: allergy medications and Rescue inhaler.  Symptoms are: unchanged.  Triage Disposition: See PCP Within 2 Weeks  Patient/caregiver understands and will follow disposition?: Yes  Copied from CRM [id number]. Topic: Clinical - Red Word Triage >> Jul 14, 2024  1:17 PM Ismael A wrote: [facility] that prompted transfer to Nurse Triage:  pt states she is having difficulty breathing, states SOB is an ongoing problem - she states she discussed having a biologic prescribed   Considering biologic therapy due to recurrent exacerbations and minimal improvement with allergy shots. Discussed potential use of biologics like Xolair or Dupixent Reason for Disposition  [1] MILD longstanding difficulty breathing (e.g., minimal/no SOB at rest, SOB with walking, pulse < 100) AND [2] SAME as normal  Answer Assessment - Initial Assessment Questions 1. RESPIRATORY STATUS: Describe your breathing? (e.g., wheezing, shortness of breath, unable to speak, severe coughing)      Pt called in stating she has chronic SOB and is ready to advance to biologic therapy. Discussed she is not currently in distress but with cooler weather causing her to be around environmental irritants (cats and dogs) her allergies and SOB is worsening. She is currently taking medication as directed and has albuterol  inhaler PRN. Discussed appt scheduled 12/17 for f/u but that I would send message to [facility] office for initiation of biologic PA. Discussed office would be in contact with her if they needed any info for patient assistance programs to ensure medication is cost effective. She voiced  appreciation. Patient agrees with plan of care, and will call back if anything changes, or if symptoms worsen.  Protocols used: Breathing Difficulty-A-AH

## 2024-07-17 NOTE — Telephone Encounter (Signed)
 Please advise on Biologic

## 2024-07-18 NOTE — Telephone Encounter (Signed)
Pt notified on VM. 

## 2024-07-18 NOTE — Telephone Encounter (Signed)
 We can discuss on 12/17 at her visit, she will need to sign paper work

## 2024-07-26 ENCOUNTER — Encounter: Payer: Self-pay | Admitting: Primary Care

## 2024-07-26 ENCOUNTER — Ambulatory Visit: Admitting: Primary Care

## 2024-07-26 VITALS — BP 122/68 | HR 83 | Temp 97.6°F | Ht 71.0 in | Wt 179.0 lb

## 2024-07-26 DIAGNOSIS — J4540 Moderate persistent asthma, uncomplicated: Secondary | ICD-10-CM

## 2024-07-26 NOTE — Progress Notes (Signed)
 @Patient  ID: Rachel Fuentes, female    DOB: 1961-04-02, 63 y.o.   MRN: [id number]  Chief Complaint  Patient presents with   Asthma    Referring provider: Dyane Anthony RAMAN, FNP  HPI:  63 year old female, never smoked.  Past medical history significant for moderate persistent asthma, LPR.  Patient of Dr. Neda.  Previous [facility] pulmonary encounter: 07/07/23- Dr. Neda  Symptoms are a bit better Continues to use Trelegy  Uses albuterol  as needed  Multiple allergens to cats and dogs -No change in her immediate environment  Previously was taking allergy shots  Asthma is for most of her adult life  She does have a history of reflux-on famotidine History of gastroparesis, fibromyalgia, osteoarthritis, anxiety and depression  Occupational history-office work  Any kind of activity recently contributes to shortness of breath  Pets: Dogs and cats  Occupation: Office work, grocery  Exposures: New flooring did make symptoms worse  Smoking history: Never smoker, was exposed to a lot of secondhand smoke growing up  10/07/2022 Patient presents today for follow-up asthma  Hx asthma for most of her life. She has multiple allergens to cats/dogs, dust mites. IgE 123/ Eos absolute 100 in Feb 2023 Maintained on Trelegy one puff daily She is doing well today without acute asthma symptoms She has chronic dyspnea, reports difficulty taking a deep breath.  Echocardiogram in April 2023 showed normal EF with mild diastolic dysfunction  No overt chest tightness or wheezing No recent asthma exacerbations requiring oral prednisone  Spirometry today was normal without obstruction. She did not have full pulmonary function testing as PFTs were only scheduled for 30 mins. Patient did not want to reschedule visit.    05/15/2024- interim hx  Discussed the use of AI scribe software for clinical note transcription with the patient, who gave verbal consent to proceed.  History of  Present Illness Rachel Fuentes is a 63 year old female with asthma who presents for a follow-up visit.  Hx asthma for most of her life. She has multiple allergens to cats/dogs, dust mites. IgE 123/ Eos absolute 100 in Feb 2023  She is experiencing a flare-up of her asthma, which typically occurs twice a year in the spring and fall. During these episodes, she experiences increased shortness of breath and a 'croaky kind of cough,' significantly impacting her energy levels. These symptoms have persisted for about a month.  Her current medication regimen includes Trelegy 200 mcg, Singulair , mometasone  nasal spray, and Xyzal . She uses her rescue inhaler more frequently during flare-ups, and albuterol  provides relief. She is also on allergy shots under the care of Dr. Frutoso at [facility], which she has been receiving for over five years. Despite this, allergy testing shows significant allergies to cats, dogs, roaches, and dust mites, all present in her home.  She has a history of moderate persistent asthma and laryngeal reflux. She has never smoked. She has multiple allergies, including to cats and dogs, and continues to receive allergy shots. She reports minimal improvement during flare-ups despite her current treatment regimen.  She mentions having had difficulty with off-gassing from luxury vinyl tile installed in her home a few years ago, which initially led her to seek pulmonary care. Her regular allergist has deferred prescribing Trelegy, requiring her to obtain it through her pulmonologist, leading to additional copays.  Lower doses of prednisone  are ineffective, while higher doses cause a rash, but she finds relief with higher doses despite the side effects.     07/26/2024- Interim  hx  Discussed the use of AI scribe software for clinical note transcription with the patient, who gave verbal consent to proceed.  History of Present Illness Rachel Fuentes is a 63  year old female with moderate persistent asthma who presents for discussion of biologic treatment options.  She experiences recurrent asthma exacerbations, particularly in the spring and fall, triggered by environmental allergens such as mold and pets. Despite being on the maximum dose of Trelegy at 200 mcg, she has required prednisone  four to five times since January of last year to manage her symptoms.  In October, her fractional exhaled nitric oxide  (FeNO) level was 31. Her IgE level was 98, and her eosinophil count was 347, both elevated. She experiences shortness of breath and chest tightness during flare-ups but no wheezing or mucus production. Prednisone  helps alleviate her symptoms.  She recently visited urgent care due to difficulty breathing and completed a five-day course of prednisone , which cleared her symptoms. She uses albuterol  as needed, taking three to four puffs at a time, which leads to coughing that eventually clears her airways.  Her current medications include Trelegy 200mcg daily and Singulair . 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   Pulmonary testing: 10/07/2022 FVC 4.16 (100%), FEV1 3.31 (103%), ratio 80, DLCOcor 24.69 (98%) 05/15/24>> IGE 98; Eos 347   Allergies[1]  Immunization History  Administered Date(s) Administered   INFLUENZA, HIGH DOSE SEASONAL PF 10/10/2019, 10/09/2020   Influenza Whole 05/16/2020   Influenza, Quadrivalent, Recombinant, Inj, Pf 05/06/2019   Influenza, Seasonal, Injecte, Preservative Fre 05/15/2024   PFIZER(Purple Top)SARS-COV-2 Vaccination 10/09/2020   Pneumococcal Polysaccharide-23 10/10/2019, 10/09/2020    Past Medical History:  Diagnosis Date   Asthma    Fibromyalgia     Tobacco History: Tobacco Use History[2] Counseling given: Not Answered   Outpatient Medications Prior to Visit  Medication Sig Dispense Refill    acetaminophen  (TYLENOL ) 325 MG tablet Take 2 tablets (650 mg total) by mouth every 6 (six) hours as needed for headache, mild pain (pain score 1-3) or fever.     albuterol  (VENTOLIN  HFA) 108 (90 Base) MCG/ACT inhaler Inhale 2 puffs into the lungs every 6 (six) hours as needed for wheezing or shortness of breath. 6.7 g 3   B Complex Vitamins (B COMPLEX PO) Take 1 capsule by mouth daily.     buPROPion (WELLBUTRIN XL) 300 MG 24 hr tablet Take 300 mg by mouth daily.  3   CALCIUM PO Take 1 tablet by mouth daily.     diclofenac (VOLTAREN) 50 MG EC tablet Take 50 mg by mouth 2 (two) times daily.  3   EPINEPHrine 0.3 mg/0.3 mL IJ SOAJ injection Inject 0.3 mg into the muscle as needed.     Fluticasone-Umeclidin-Vilant (TRELEGY ELLIPTA ) 200-62.5-25 MCG/ACT AEPB Inhale 1 puff into the lungs daily. NEEDS APPT FOR FURTHER REFILLS 28 each 11   gabapentin  (NEURONTIN ) 400 MG capsule Take 800 mg by mouth at bedtime.  2   HYDROcodone-acetaminophen  (NORCO/VICODIN) 5-325 MG tablet Take 1 tablet by mouth daily as needed for severe pain (pain score 7-10) or moderate pain (pain score 4-6).     ipratropium (ATROVENT ) 0.06 % nasal spray Place 2 sprays into the nose daily. 15 mL 5   levocetirizine (XYZAL ) 5 MG tablet Take 1 tablet (5 mg total) by mouth daily. 30 tablet 11   levothyroxine  (SYNTHROID , LEVOTHROID) 50 MCG tablet Take 50 mcg by mouth daily  before breakfast.  2   metroNIDAZOLE (METROGEL) 0.75 % vaginal gel 1 Application.     mometasone  (NASONEX ) 50 MCG/ACT nasal spray Place 1 spray into the nose daily. 1 each 5   montelukast  (SINGULAIR ) 10 MG tablet Take 1 tablet (10 mg total) by mouth at bedtime. 30 tablet 11   Multiple Vitamins-Minerals (MULTIVITAMIN WITH MINERALS) tablet Take 1 tablet by mouth daily.     traZODone  (DESYREL ) 100 MG tablet Take 200 mg by mouth at bedtime.     valACYclovir (VALTREX) 500 MG tablet Take 500 mg by mouth daily.     venlafaxine XR (EFFEXOR-XR) 37.5 MG 24 hr capsule Take 37.5 mg by  mouth daily with breakfast.     aspirin EC 81 MG tablet Take 81 mg by mouth 2 (two) times daily.     FLUoxetine (PROZAC) 20 MG capsule Take 20 mg by mouth daily.     FLUoxetine (PROZAC) 40 MG capsule Take 80 mg by mouth daily.  3   lidocaine  (XYLOCAINE ) 5 % ointment Apply 1 Application topically 3 (three) times daily as needed (externally only for anal pain). 2 hours before skin procedure. 50 g 0   mesalamine  (CANASA ) 1000 MG suppository Place 1 suppository (1,000 mg total) rectally at bedtime for 14 days. 14 suppository 0   ondansetron  (ZOFRAN ) 4 MG tablet Take 1 tablet (4 mg total) by mouth every 6 (six) hours as needed for nausea. 20 tablet 0   No facility-administered medications prior to visit.   Review of Systems  Review of Systems  Constitutional: Negative.   Respiratory: Negative.     Physical Exam  BP 122/68   Pulse 83   Temp 97.6 F (36.4 C)   Ht 5' 11 (1.803 m) Comment: pt stated  Wt 179 lb (81.2 kg)   SpO2 96% Comment: ra  BMI 24.97 kg/m  Physical Exam Constitutional:      General: She is not in acute distress.    Appearance: Normal appearance. She is not ill-appearing.  HENT:     Head: Normocephalic and atraumatic.     Mouth/Throat:     Mouth: Mucous membranes are moist.     Pharynx: Oropharynx is clear.  Cardiovascular:     Rate and Rhythm: Normal rate and regular rhythm.  Pulmonary:     Effort: Pulmonary effort is normal.     Breath sounds: Normal breath sounds.  Musculoskeletal:        General: Normal range of motion.  Skin:    General: Skin is warm and dry.  Neurological:     General: No focal deficit present.     Mental Status: She is alert and oriented to person, place, and time. Mental status is at baseline.  Psychiatric:        Mood and Affect: Mood normal.        Behavior: Behavior normal.        Thought Content: Thought content normal.        Judgment: Judgment normal.      Lab Results:  CBC    Component Value Date/Time   WBC 5.6  05/15/2024 1012   RBC 4.19 05/15/2024 1012   HGB 12.5 05/15/2024 1012   HCT 37.6 05/15/2024 1012   PLT 234 05/15/2024 1012   MCV 89.7 05/15/2024 1012   MCH 29.8 05/15/2024 1012   MCHC 33.2 05/15/2024 1012   RDW 12.7 05/15/2024 1012   LYMPHSABS 0.8 09/21/2023 2352   MONOABS 0.8 09/21/2023 2352   EOSABS 347 05/15/2024  1012   BASOSABS 62 05/15/2024 1012    BMET    Component Value Date/Time   NA 140 09/24/2023 0513   K 3.2 (L) 09/24/2023 0513   CL 104 09/24/2023 0513   CO2 23 09/24/2023 0513   GLUCOSE 86 09/24/2023 0513   BUN <5 (L) 09/24/2023 0513   CREATININE 0.76 09/24/2023 0513   CALCIUM 9.1 09/24/2023 0513   GFRNONAA >60 09/24/2023 0513   GFRAA >60 05/22/2017 2024    BNP No results found for: BNP  ProBNP    Component Value Date/Time   PROBNP 25.0 09/25/2021 1110    Imaging: No results found.   Assessment & Plan:   1. Moderate persistent asthma without complication (Primary) - Ambulatory referral to Pharmacotherapy Clinic  Assessment and Plan Assessment & Plan Moderate persistent asthma with recurrent exacerbations Moderate persistent asthma with recurrent exacerbations, particularly in spring and fall, triggered by environmental allergens such as mold and pets. Currently on maximum dose of Trelegy and Singulair . Recent exacerbation required prednisone , indicating suboptimal control. Elevated IgE 98 and eosinophils 347 suggest allergic component. Biologics considered due to recurrent exacerbations and elevated allergy markers. Discussed risks and benefits of biologics, including potential side effects such as injection site reactions, upper respiratory tract infections, and rare risks of eosinophilic pneumonia and blood cancers. Benefits include reduced respiratory infections, improved lung function, and decreased need for prednisone . Tezspire chosen due to newer status and lower risk profile compared to other biologics. She expressed willingness to try biologics  despite concerns about side effects. - Started approval process for [employer]. - Referred to pharmacotherapy clinic for initial injection, education and monitoring. - Will schedule follow-up appointment 6-8 weeks after starting Tezspire to assess response. - Provided information on Tezspire for discharge.   Almarie LELON Ferrari, NP 07/26/2024     [1]  Allergies Allergen Reactions   American Cockroach Shortness Of Breath   Dust Mite Extract Shortness Of Breath   Dog Epithelium (Canis Lupus Familiaris) Other (See Comments)    Swelling of eyes, respiratory distress.    Cat Dander Other (See Comments)    Swelling of eyes, respiratory distress.   [2]  Social History Tobacco Use  Smoking Status Never  Smokeless Tobacco Never

## 2024-07-26 NOTE — Patient Instructions (Addendum)
 VISIT SUMMARY: Today, we discussed your moderate persistent asthma and the recurrent exacerbations you experience, particularly in the spring and fall. We reviewed your current medications and considered biologic treatment options due to your frequent need for prednisone  and elevated allergy markers.  YOUR PLAN: -MODERATE PERSISTENT ASTHMA WITH RECURRENT EXACERBATIONS: Moderate persistent asthma is a chronic condition where your airways are inflamed and narrowed, causing breathing difficulties. Your asthma is particularly triggered by environmental allergens like mold and pets, leading to frequent flare-ups. Despite being on the maximum dose of Trelegy and Singulair , you have had to use prednisone  multiple times. We discussed the option of starting a biologic treatment called Tezspire, which can help reduce respiratory infections, improve lung function, and decrease the need for prednisone . We also talked about the potential side effects, including injection site reactions and rare risks like eosinophilic pneumonia and blood cancers. You agreed to try Tezspire, and we have started the approval process. You will be referred to the pharmacotherapy clinic for your initial injection and monitoring. We will schedule a follow-up appointment 6-8 weeks after starting Tezspire to assess your response. Information on Tezspire has been provided to you for discharge.  INSTRUCTIONS: You will be referred to the pharmacotherapy clinic for your initial Tezspire injection and monitoring. Please schedule a follow-up appointment with us  6-8 weeks after starting Tezspire to assess your response.   Tezepelumab Injection What is this medication? TEZEPELUMAB (TEZ e PEL ue mab) prevents and treats the symptoms of asthma. It works by decreasing inflammation of the airways, making it easier to breathe. It is prescribed when other asthma medications have not worked well enough. Do not use it to treat a sudden asthma attack. It  is a monoclonal antibody. This medicine may be used for other purposes; ask your health care provider or pharmacist if you have questions. COMMON BRAND NAME(S): TEZSPIRE What should I tell my care team before I take this medication? They need to know if you have any of these conditions: Parasitic (helminth) infection An unusual or allergic reaction to tezepelumab, hamster proteins, other medications, foods, dyes, or preservatives Pregnant or trying to get pregnant Breast-feeding How should I use this medication? This medication is injected under the skin. It is given by your care team in a hospital or clinic setting. It may also be given at home. If you get this medication at home, you will be taught how to prepare and give it. Use exactly as directed. Take it as directed on the prescription label at the same time every day. Keep taking it unless your care team tells you to stop If you use a pen, be sure to take off the outer needle cover before using the dose. It is important that you put your used needles and syringes in a special sharps container. Do not put them in a trash can. If you do not have a sharps container, call your pharmacist or care team to get one. Talk to your care team about the use of this medication in children. While it may be prescribed for children as young as 12 years for selected conditions, precautions do apply. Overdosage: If you think you have taken too much of this medicine contact a poison control center or emergency room at once. NOTE: This medicine is only for you. Do not share this medicine with others. What if I miss a dose? Keep appointments for follow-up doses. It is important not to miss your dose. Call your care team if you are unable to keep  an appointment. What may interact with this medication? Interactions are not expected. This list may not describe all possible interactions. Give your health care provider a list of all the medicines, herbs,  non-prescription drugs, or dietary supplements you use. Also tell them if you smoke, drink alcohol, or use illegal drugs. Some items may interact with your medicine. What should I watch for while using this medication? Visit your care team for regular checks on your progress. Tell your care team if your symptoms do not start to get better or if they get worse. Talk to your care team about how to treat an acute asthma attack or bronchospasm (wheezing). Be sure to always have a short-acting inhaler with you. If you use your short-acting inhaler and your symptoms do not get better or if they get worse, call your care team right away. You and your care team should develop an Asthma Action Plan that is just for you. Be sure to know what to do if you are in the yellow (asthma is getting worse) or red (medical alert) zones. What side effects may I notice from receiving this medication? Side effects that you should report to your care team as soon as possible: Allergic reactions--skin rash, itching, hives, swelling of the face, lips, tongue, or throat Side effects that usually do not require medical attention (report these to your care team if they continue or are bothersome): Back pain Joint pain Sore throat This list may not describe all possible side effects. Call your doctor for medical advice about side effects. You may report side effects to FDA at 1-800-FDA-1088. Where should I keep my medication? Keep out of the reach of children and pets. This medication may be given in a hospital or clinic. If it is used at home, store in a refrigerator or at room temperature between 20 and 25 degrees C (68 and 77 degrees F). Refrigeration (preferred): Store it in the refrigerator. Keep it in the original carton until you are ready to take it. Get rid of any unused medication after the expiration date. Room Temperature: This medication may be stored at room temperature for up to 30 days. Do not put the medication  back into the refrigerator once it has reached room temperature. If it is stored at room temperature, get rid of any unused medication after 30 days or after it expires, whichever is first. NOTE: This sheet is a summary. It may not cover all possible information. If you have questions about this medicine, talk to your doctor, pharmacist, or health care provider.  2024 Elsevier/Gold Standard (2021-10-09 00:00:00)

## 2024-07-27 NOTE — Telephone Encounter (Signed)
 Please advise

## 2024-07-28 ENCOUNTER — Telehealth: Payer: Self-pay

## 2024-07-28 NOTE — Telephone Encounter (Signed)
 Reviewed during visit, starting approval process for tezspire

## 2024-07-28 NOTE — Telephone Encounter (Signed)
 Received referral for new start Tezspire. Opening benefits investigation in this thread.

## 2024-07-30 NOTE — Telephone Encounter (Signed)
 Received notification from [facility] regarding a prior authorization for TEZSPIRE. Authorization has been APPROVED from 07/30/24 to 01/28/25. Approval letter sent to scan center.  Authorization #  [id number]

## 2024-07-30 NOTE — Telephone Encounter (Signed)
 Submitted a Prior Authorization request to [facility] for TEZSPIRE via CoverMyMeds. Will update once we receive a response.  If [employer] has not replied to your request within 24 hours please contact [employer] at [phone].  Key: [id number]

## 2024-07-31 ENCOUNTER — Ambulatory Visit

## 2024-07-31 DIAGNOSIS — Z1231 Encounter for screening mammogram for malignant neoplasm of breast: Secondary | ICD-10-CM

## 2024-07-31 NOTE — Telephone Encounter (Signed)
 Patient enrolled into Tezspire copay card  Patient must fill through [facility]: [phone]  Your confirmation number is: [id number] ID: [id number] Group: [id number] BIN: [id number] PCN: [id number]  Sherry Pennant, PharmD, MPH, BCPS, CPP Clinical Pharmacist

## 2024-08-09 NOTE — Telephone Encounter (Signed)
 Spoke to patient - scheduled for first Tezspire injection on 08/21/24. Will use sample.

## 2024-08-09 NOTE — Telephone Encounter (Signed)
 ATC patient for new start Tezspire appointment. Left HIPAA compliant VM with my office number to return call.

## 2024-08-21 ENCOUNTER — Ambulatory Visit

## 2024-08-21 DIAGNOSIS — J4540 Moderate persistent asthma, uncomplicated: Secondary | ICD-10-CM

## 2024-08-21 DIAGNOSIS — Z7189 Other specified counseling: Secondary | ICD-10-CM

## 2024-08-21 MED ORDER — TEZSPIRE 210 MG/1.91ML ~~LOC~~ SOAJ: 210.0000 mg | SUBCUTANEOUS | 3 refills | Status: AC

## 2024-08-21 NOTE — Patient Instructions (Signed)
 Your next Tezspire  dose is due on 09/18/24, 10/16/24, and every 28 days thereafter  This medication is stored in the refrigerator. Please note, you should remove the drug from the refrigerator AT LEAST 45 minutes prior to injection to ensure the injection comes down to room temperature before injecting the medication.   If needed, this medication can be stored at room temperature for no longer than 30 days.   If you miss a dose, you should take it as soon as you remember it.   As a reminder, some side effects include injection site reaction, headache, and joint ache. If you are struggling with side effects, please call our office.   It can take several weeks to see the potential benefit of the medication. Please continue your other medications.   CONTINUE all of your remaining medications as prescribed.  Your prescription will be shipped from [facility]: [phone].   Please call to schedule shipment and confirm address. They will mail your medication to your home.  Your copay should be affordable. If you call the pharmacy and it is not affordable, please double-check that they are billing through your copay card as secondary coverage.  Tezspire  copay card information -  Your confirmation number is: [id number] ID: [id number] Group: [id number] BIN: [id number] PCN: [id number]      You will need to be seen by your provider in 3 to 4 months to assess how Tezspire  is working for you. You have a follow-up appointment scheduled 09/20/24 with Landry Ferrari, NP.   Stay up to date on all routine vaccines: influenza, pneumonia, COVID19, Shingles  How to manage an injection site reaction: Remember the 5 C's: COUNTER - leave on the counter at least 30 minutes but up to overnight to bring medication to room temperature. This may help prevent stinging COLD - place something cold (like an ice gel pack or cold water bottle) on the injection site just before cleansing with alcohol. This may help  reduce pain CLARITIN - use Claritin (generic name is loratadine) for the first two weeks of treatment or the day of, the day before, and the day after injecting. This will help to minimize injection site reactions CORTISONE CREAM - apply if injection site is irritated and itching CALL ME - if injection site reaction is bigger than the size of your fist, looks infected, blisters, or if you develop hives

## 2024-08-21 NOTE — Progress Notes (Signed)
 ATC patient at [phone] - Left HIPAA compliant VM letting hr know she can call [phone] if she needs to reschedule, but otherwise she can come in for Tezspire  injection today. Unable to contact patient via phone sooner d/t technology barriers.

## 2024-08-21 NOTE — Progress Notes (Signed)
 "  HPI Patient presents today to [facility] to see pharmacy team for first injection of Tezspire  and new start education. Past medical history includes moderate persistent asthma.   Last seen by Landry Ferrari, NP, on 07/26/24. Noted history of moderate persistent asthma with recurrent exacerbations, particularly in spring and fall, triggered by environmental allergens such as mold and pets. Currently on maximum dose of Trelegy and Singulair . Recent exacerbation required prednisone .  Respiratory Medications Current regimen:  - Trelegy 200-62.5-25 mcg/act (Inhale 1 puff into the lungs daily) - albuterol  108 mcg/act inhaler (Inhale 2 puffs into the lungs every 6 (six) hours as needed for wheezing or shortness of breath) - montelukast  10mg  tablet (Take 1 tablet (10 mg total) by mouth at bedtime) - levocetirizine 5mg  tablet (Take 1 tablet (5 mg total) by mouth daily)   OBJECTIVE Allergies[1]  Outpatient Encounter Medications as of 08/21/2024  Medication Sig Note   acetaminophen  (TYLENOL ) 325 MG tablet Take 2 tablets (650 mg total) by mouth every 6 (six) hours as needed for headache, mild pain (pain score 1-3) or fever.    albuterol  (VENTOLIN  HFA) 108 (90 Base) MCG/ACT inhaler Inhale 2 puffs into the lungs every 6 (six) hours as needed for wheezing or shortness of breath.    aspirin EC 81 MG tablet Take 81 mg by mouth 2 (two) times daily.    B Complex Vitamins (B COMPLEX PO) Take 1 capsule by mouth daily.    buPROPion (WELLBUTRIN XL) 300 MG 24 hr tablet Take 300 mg by mouth daily.    CALCIUM PO Take 1 tablet by mouth daily.    diclofenac (VOLTAREN) 50 MG EC tablet Take 50 mg by mouth 2 (two) times daily.    EPINEPHrine 0.3 mg/0.3 mL IJ SOAJ injection Inject 0.3 mg into the muscle as needed.    FLUoxetine (PROZAC) 20 MG capsule Take 20 mg by mouth daily.    FLUoxetine (PROZAC) 40 MG capsule Take 80 mg by mouth daily.    Fluticasone-Umeclidin-Vilant (TRELEGY ELLIPTA ) 200-62.5-25 MCG/ACT AEPB  Inhale 1 puff into the lungs daily. NEEDS APPT FOR FURTHER REFILLS    gabapentin  (NEURONTIN ) 400 MG capsule Take 800 mg by mouth at bedtime.    HYDROcodone-acetaminophen  (NORCO/VICODIN) 5-325 MG tablet Take 1 tablet by mouth daily as needed for severe pain (pain score 7-10) or moderate pain (pain score 4-6).    ipratropium (ATROVENT ) 0.06 % nasal spray Place 2 sprays into the nose daily.    levocetirizine (XYZAL ) 5 MG tablet Take 1 tablet (5 mg total) by mouth daily.    levothyroxine  (SYNTHROID , LEVOTHROID) 50 MCG tablet Take 50 mcg by mouth daily before breakfast. 09/22/2023: Pt is adamant she is still taking this medication daily. Dispense report does not support this claim.    lidocaine  (XYLOCAINE ) 5 % ointment Apply 1 Application topically 3 (three) times daily as needed (externally only for anal pain). 2 hours before skin procedure.    mesalamine  (CANASA ) 1000 MG suppository Place 1 suppository (1,000 mg total) rectally at bedtime for 14 days.    metroNIDAZOLE (METROGEL) 0.75 % vaginal gel 1 Application.    mometasone  (NASONEX ) 50 MCG/ACT nasal spray Place 1 spray into the nose daily.    montelukast  (SINGULAIR ) 10 MG tablet Take 1 tablet (10 mg total) by mouth at bedtime.    Multiple Vitamins-Minerals (MULTIVITAMIN WITH MINERALS) tablet Take 1 tablet by mouth daily.    ondansetron  (ZOFRAN ) 4 MG tablet Take 1 tablet (4 mg total) by mouth every 6 (six) hours  as needed for nausea.    traZODone  (DESYREL ) 100 MG tablet Take 200 mg by mouth at bedtime.    valACYclovir (VALTREX) 500 MG tablet Take 500 mg by mouth daily.    venlafaxine XR (EFFEXOR-XR) 37.5 MG 24 hr capsule Take 37.5 mg by mouth daily with breakfast.    No facility-administered encounter medications on file as of 08/21/2024.     Immunization History  Administered Date(s) Administered   INFLUENZA, HIGH DOSE SEASONAL PF 10/10/2019, 10/09/2020   Influenza Whole 05/16/2020   Influenza, Quadrivalent, Recombinant, Inj, Pf 05/06/2019    Influenza, Seasonal, Injecte, Preservative Fre 05/15/2024   PFIZER(Purple Top)SARS-COV-2 Vaccination 10/09/2020   Pneumococcal Polysaccharide-23 10/10/2019, 10/09/2020     PFTs    Latest Ref Rng & Units 10/07/2022   10:53 AM 12/13/2020   11:03 AM  PFT Results  FVC-Pre L 4.16  4.40   FVC-Predicted Pre % 100  104   FVC-Post L  4.31   FVC-Predicted Post %  102   Pre FEV1/FVC % % 80  78   Post FEV1/FCV % %  81   FEV1-Pre L 3.31  3.45   FEV1-Predicted Pre % 103  105   FEV1-Post L  3.49   DLCO uncorrected ml/min/mmHg 23.98  25.15   DLCO UNC% % 95  99   DLCO corrected ml/min/mmHg 24.69  25.15   DLCO COR %Predicted % 98  99   DLVA Predicted % 93  97   TLC L  7.19   TLC % Predicted %  117   RV % Predicted %  121      Eosinophils Most recent blood eosinophil count was 347 cells/microL taken on 05/15/24.   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 This blocks TSLP from its effect on inflammation including reduce eosinophils, IgE, FeNO, IL-5, and IL-13. Mechanism is not definitively established. Reviewed that Tezspire  is add-on medication and patient must continue maintenance inhaler regimen. Response to therapy: may take 3-4 months to determine efficacy.  Side effects: injection site reaction (6-18%), antibody development (2%), arthralgia (4%), back pain (4%), pharyngitis (4%)  Dose: Tezspire  210 mg once every 4 weeks  Administration/Storage:  Reviewed administration sites of thigh or abdomen (at least 2-3 inches away from abdomen). Reviewed the upper arm is only appropriate if caregiver is administering injection  Do not shake pen/syringe as this could lead to product foaming or precipitation. Do not shake syringe as this could lead to product foaming or precipitation.  Access: Approval of Tezspire  through: insurance Patient enrolled into copay card  program to help with copay assistance.  PharmD administered Tezspire  210mg /1.91 ml in left lower abdomen using sample  Tezspire  210mg /1.91 ml prefilled syringe NDC: 44486-887-03 Lot: [id number] Expiration: 30JUN2026  Patient monitored for 30 minutes for adverse reaction.  Patient tolerated well. Injection site noted. Patient denies itchiness and irritation at injection.  PLAN Continue Tezspire  210mg  SQ every 28 days.  Rx sent to: [facility]: [phone].   Continue remaining asthma regimen of: Trelegy 200-62.5-25 mcg/act (Inhale 1 puff into the lungs daily), albuterol  108 mcg/act inhaler (Inhale 2 puffs into the lungs every 6 (six) hours as needed for wheezing or shortness of breath), montelukast  10mg  tablet (Take 1 tablet (10 mg total) by mouth at bedtime), levocetirizine 5mg  tablet (Take 1 tablet (5 mg total) by mouth daily)  All questions encouraged and answered.  Instructed patient to  reach out with any further questions or concerns.  Thank you for allowing pharmacy to participate in this patient's care.  This appointment required 45 minutes of patient care (this includes precharting, chart review, review of results, face-to-face care, etc.).  Aleck Puls, PharmD, BCPS, CPP Clinical Pharmacist  [facility]  [facility]    [1]  Allergies Allergen Reactions   American Cockroach Shortness Of Breath   Dust Mite Extract Shortness Of Breath   Dog Epithelium (Canis Lupus Familiaris) Other (See Comments)    Swelling of eyes, respiratory distress.    Cat Dander Other (See Comments)    Swelling of eyes, respiratory distress.    "

## 2024-08-22 NOTE — Telephone Encounter (Signed)
 First dose in clinic 08/21/24. Tolerated well.

## 2024-09-20 ENCOUNTER — Ambulatory Visit: Admitting: Primary Care
